# Patient Record
Sex: Female | Born: 1980 | Race: White | Hispanic: No | Marital: Married | Smoking: Never smoker
Health system: Southern US, Community
[De-identification: ages and names within clinical notes are randomized; demographics above are authoritative.]

## PROBLEM LIST (undated history)

## (undated) ENCOUNTER — Inpatient Hospital Stay (HOSPITAL_COMMUNITY): Payer: Self-pay

## (undated) DIAGNOSIS — M51369 Other intervertebral disc degeneration, lumbar region without mention of lumbar back pain or lower extremity pain: Secondary | ICD-10-CM

## (undated) DIAGNOSIS — M5136 Other intervertebral disc degeneration, lumbar region: Secondary | ICD-10-CM

## (undated) DIAGNOSIS — R002 Palpitations: Secondary | ICD-10-CM

## (undated) HISTORY — PX: BACK SURGERY: SHX140

## (undated) HISTORY — DX: Palpitations: R00.2

## (undated) HISTORY — PX: BREAST SURGERY: SHX581

## (undated) HISTORY — PX: TONSILLECTOMY: SUR1361

## (undated) HISTORY — DX: Other intervertebral disc degeneration, lumbar region without mention of lumbar back pain or lower extremity pain: M51.369

## (undated) HISTORY — DX: Other intervertebral disc degeneration, lumbar region: M51.36

## (undated) HISTORY — PX: ABDOMINAL HYSTERECTOMY: SHX81

## (undated) HISTORY — PX: DILATION AND CURETTAGE OF UTERUS: SHX78

---
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
## 2000-08-03 ENCOUNTER — Encounter
Admission: RE | Admit: 2000-08-03 | Discharge: 2000-08-11 | Payer: Self-pay | Admitting: Physical Medicine and Rehabilitation

## 2000-12-23 ENCOUNTER — Other Ambulatory Visit: Admission: RE | Admit: 2000-12-23 | Discharge: 2001-01-10 | Payer: Self-pay

## 2002-02-02 ENCOUNTER — Encounter: Payer: Self-pay | Admitting: Neurosurgery

## 2002-02-06 ENCOUNTER — Ambulatory Visit (HOSPITAL_COMMUNITY): Admission: RE | Admit: 2002-02-06 | Discharge: 2002-02-07 | Payer: Self-pay | Admitting: Neurosurgery

## 2002-02-06 ENCOUNTER — Encounter: Payer: Self-pay | Admitting: Neurosurgery

## 2003-02-22 ENCOUNTER — Inpatient Hospital Stay (HOSPITAL_COMMUNITY): Admission: RE | Admit: 2003-02-22 | Discharge: 2003-02-24 | Payer: Self-pay | Admitting: Neurosurgery

## 2003-02-22 ENCOUNTER — Encounter: Payer: Self-pay | Admitting: Neurosurgery

## 2003-03-17 ENCOUNTER — Encounter: Payer: Self-pay | Admitting: Emergency Medicine

## 2003-03-17 ENCOUNTER — Emergency Department (HOSPITAL_COMMUNITY): Admission: EM | Admit: 2003-03-17 | Discharge: 2003-03-17 | Payer: Self-pay | Admitting: Emergency Medicine

## 2003-04-22 ENCOUNTER — Other Ambulatory Visit: Admission: RE | Admit: 2003-04-22 | Discharge: 2003-04-22 | Payer: Self-pay | Admitting: Internal Medicine

## 2004-06-03 ENCOUNTER — Other Ambulatory Visit: Admission: RE | Admit: 2004-06-03 | Discharge: 2004-06-03 | Payer: Self-pay | Admitting: Obstetrics & Gynecology

## 2005-07-12 ENCOUNTER — Other Ambulatory Visit: Admission: RE | Admit: 2005-07-12 | Discharge: 2005-07-12 | Payer: Self-pay | Admitting: Obstetrics & Gynecology

## 2006-07-26 ENCOUNTER — Emergency Department (HOSPITAL_COMMUNITY): Admission: EM | Admit: 2006-07-26 | Discharge: 2006-07-26 | Payer: Self-pay | Admitting: Family Medicine

## 2006-07-27 ENCOUNTER — Emergency Department (HOSPITAL_COMMUNITY): Admission: EM | Admit: 2006-07-27 | Discharge: 2006-07-27 | Payer: Self-pay | Admitting: Emergency Medicine

## 2008-09-17 ENCOUNTER — Ambulatory Visit (HOSPITAL_COMMUNITY): Admission: RE | Admit: 2008-09-17 | Discharge: 2008-09-17 | Payer: Self-pay | Admitting: Obstetrics and Gynecology

## 2008-09-18 ENCOUNTER — Inpatient Hospital Stay (HOSPITAL_COMMUNITY): Admission: AD | Admit: 2008-09-18 | Discharge: 2008-09-18 | Payer: Self-pay | Admitting: Obstetrics and Gynecology

## 2009-03-25 ENCOUNTER — Ambulatory Visit (HOSPITAL_COMMUNITY): Admission: RE | Admit: 2009-03-25 | Discharge: 2009-03-25 | Payer: Self-pay | Admitting: Obstetrics and Gynecology

## 2009-03-25 ENCOUNTER — Encounter (INDEPENDENT_AMBULATORY_CARE_PROVIDER_SITE_OTHER): Payer: Self-pay | Admitting: Obstetrics and Gynecology

## 2009-09-15 ENCOUNTER — Encounter: Payer: Self-pay | Admitting: Cardiology

## 2009-10-15 ENCOUNTER — Ambulatory Visit: Admission: RE | Admit: 2009-10-15 | Discharge: 2009-10-15 | Payer: Self-pay | Admitting: Obstetrics and Gynecology

## 2009-10-15 ENCOUNTER — Encounter (INDEPENDENT_AMBULATORY_CARE_PROVIDER_SITE_OTHER): Payer: Self-pay | Admitting: Obstetrics and Gynecology

## 2009-10-15 ENCOUNTER — Ambulatory Visit: Payer: Self-pay | Admitting: Internal Medicine

## 2009-10-31 ENCOUNTER — Inpatient Hospital Stay (HOSPITAL_COMMUNITY): Admission: AD | Admit: 2009-10-31 | Discharge: 2009-10-31 | Payer: Self-pay | Admitting: Obstetrics and Gynecology

## 2010-01-12 DIAGNOSIS — R002 Palpitations: Secondary | ICD-10-CM

## 2010-01-12 DIAGNOSIS — G8929 Other chronic pain: Secondary | ICD-10-CM | POA: Insufficient documentation

## 2010-01-12 DIAGNOSIS — M549 Dorsalgia, unspecified: Secondary | ICD-10-CM

## 2010-01-20 ENCOUNTER — Ambulatory Visit: Payer: Self-pay | Admitting: Cardiology

## 2010-04-03 ENCOUNTER — Inpatient Hospital Stay (HOSPITAL_COMMUNITY): Admission: RE | Admit: 2010-04-03 | Discharge: 2010-04-05 | Payer: Self-pay | Admitting: Obstetrics and Gynecology

## 2010-12-01 NOTE — Assessment & Plan Note (Signed)
Summary: np6/palps/25 weeks/jml   Visit Type:  new pt visit Referring Provider:  Huel Cote Primary Provider:  Huel Cote  CC:  palpitations...pt is [redacted] wk gestation...history of MVP.  History of Present Illness: Mrs Ewald is a delightful 30 year old married white female who comes today for palpitations and a history of mitral valve prolapse.  She's [redacted] weeks pregnant. She has noted increased palpitations which she has not had since grammar school over the last several weeks. She notes in particular she first lays down on her left side. 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 She thinks that her baby is compressing her heart.  She describes what sounds like an echocardiogram back years ago. She was told them by Dr.Schall that she had mitral valve prolapse. She wore monitor but does not remember she had premature beats or not. There is no history of SVT per se.  She exercises on a regular basis. Her palpitations are not made worse by this. She's had no presyncope or syncope. She denies orthostatic symptoms. She's also had no chest pain.  Current Medications (verified): 1)  Fish Oil 1000 Mg Caps (Omega-3 Fatty Acids) .Marland Kitchen.. 1 Cap Once Daily 2)  Prenatal Vitamins 0.8 Mg Tabs (Prenatal Multivit-Min-Fe-Fa) .Marland Kitchen.. 1 Tab Once Daily  Allergies (verified): 1)  ! Codeine  Past History:  Family History: Last updated: 01/20/2010 Father: recent heart disease at 55 in the last 2 yrs.  Social History: Last updated: 01/20/2010 Tobacco Use - No.  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  Social History: Tobacco Use - No.  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 Head:  normocephalic and atraumatic Eyes:  PERRLA/EOM intact; conjunctiva and lids normal. Mouth:  Teeth, gums and palate normal. Oral mucosa normal. Neck:  Neck supple, no JVD. No masses, thyromegaly or abnormal cervical nodes. Chest Ethelyn Cerniglia:  no deformities or breast masses noted Lungs:  Clear bilaterally to auscultation and percussion. Heart:  PMI nondisplaced, regular rate and rhythm, question of a split S. one at the apex no clear click. No murmur. This was in the left lateral decubitus position. Abdomen:  gravida, good bowel sounds Msk:  Back normal, normal gait. Muscle strength and tone normal. Pulses:  pulses normal in all 4 extremities Extremities:  No clubbing or cyanosis. Neurologic:  Alert and oriented x 3. Skin:  Intact without lesions or rashes. Psych:  Normal affect.   Echocardiogram  Procedure date:  10/15/2009  Findings:       Study Conclusions   Left ventricle: Systolic function was normal. The estimated ejection   fraction was in the range  of 55% to 60%. Jalonda Antigua motion was normal;   there were no regional Hadden Steig motion abnormalities.   Impressions:    - Normal study.   Transthoracic echocardiography. M-mode, complete 2D, spectral   Doppler, and color Doppler. Height: Height: 170.2cm. Height: 67in.   Weight: Weight: 54.5kg. Weight: 120lb. Body mass index: BMI:   18.8kg/m^2. Body surface area: BSA: 1.25m^2. Patient status:   Outpatient. Location: Echo laboratory.   Echocardiogram  Procedure date:  01/20/2010  Findings:      normal  sinus rhythm normal EKG  Impression & Recommendations:  Problem # 1:  PALPITATIONS (ICD-785.1) Assessment Deteriorated I have had a long discussion with her today. I have explained to having a resurgence of probably what is premature beats and palpitations is not unusual with pregnancy. I do not think she needs medical therapy. If she begins to have symptoms of SVT which I explained to her in detail today or her palpitations are associated with activity I would like to see her back for evaluation. Otherwise I'll see her p.r.n. Orders: EKG w/ Interpretation (93000)  Problem # 2:  MITRAL VALVE PROLAPSE (ICD-424.0) Her echocardiogram was read by our team back in December. She has normal left ventricular function, no Elfie Costanza motion abnormalities, no LVH, normal chamber sizes, and no prolapse. She had trivial mitral regurgitation. I reviewed this with her today at length. She does not need prophylaxis for dental procedures. All questions answered.  Patient Instructions: 1)  Your physician recommends that you schedule a follow-up appointment in: AS NEEDED 2)  Your physician recommends that you continue on your current medications as directed. Please refer to the Current Medication list given to you today.

## 2011-01-18 LAB — CBC
Hemoglobin: 11.2 g/dL — ABNORMAL LOW (ref 12.0–15.0)
Platelets: 168 10*3/uL (ref 150–400)
RBC: 3.33 MIL/uL — ABNORMAL LOW (ref 3.87–5.11)
RDW: 13.1 % (ref 11.5–15.5)

## 2011-01-18 LAB — RPR: RPR Ser Ql: NONREACTIVE

## 2011-02-01 LAB — WET PREP, GENITAL: Clue Cells Wet Prep HPF POC: NONE SEEN

## 2011-02-09 LAB — CBC
Hemoglobin: 13.6 g/dL (ref 12.0–15.0)
MCV: 93.5 fL (ref 78.0–100.0)
RBC: 4.14 MIL/uL (ref 3.87–5.11)
RDW: 13.1 % (ref 11.5–15.5)
WBC: 6.8 10*3/uL (ref 4.0–10.5)

## 2011-03-16 NOTE — H&P (Signed)
Rose [name], Rose [name] NO.:  [id number]   MEDICAL RECORD NO.:  [id number]         PATIENT TYPE:  WAMB   LOCATION:                                FACILITY:  [facility]   PHYSICIAN:  Huel Cote, M.D. DATE OF BIRTH:  07/28/1981   DATE OF ADMISSION:  03/25/2009  DATE OF DISCHARGE:                              HISTORY & PHYSICAL   The patient is a 30 year old G2, P0-0-1-0 who is coming in with an  unfortunate finding of a missed AB on office ultrasound at approximately  7+ weeks with no fetal heart motion identified.  The patient had been  being followed closely due to a prior miscarriage and unfortunately was  found to have fetal demise on her ultrasound on May 21.  At that point,  the patient had some spotting but no significant cramping.  She had not  passed any tissue, but did have off and on bleeding for several days  prior to the D and C.  She had a prior miscarriage in November 2009 and  was able to pass this on her own as it was a very early in pregnancy.  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   PAST MEDICAL HISTORY:  Relatively insignificant.   PAST SURGICAL HISTORY:  She has had two back surgeries for ruptured  disks.   PAST OBSTETRICAL HISTORY:  The previous miscarriage and six weeks as  noted.   PAST GYN HISTORY:  No abnormal Pap smears.   ALLERGIES:  Codeine.   MEDICATIONS:  She is on no medications aside from prenatal vitamins.   PHYSICAL EXAMINATION:  The patient's blood pressure is normal at 100/50.  CARDIAC:  Regular rate and rhythm.  LUNGS:  Clear.  ABDOMEN:  Soft and nontender.  PELVIC:  She has normal genitalia noted.  Cervix is closed and uterus  consistent with 7-8 weeks size.   The patient was counseled as to all of her options including expected  management and Cytotec and D and C.  Given all of these options, she  requests  a D and C and this will be set up for the patient as soon as  possible.  We will  use Cytotec prior to procedure 3-4 hours and  doxycycline before and after procedure to minimize any risk of uterine  perforation, as well as post procedure infection.  We did discuss all  risks and benefits including uterine perforation  and other risks and the patient is aware of these and desires to  proceed.  Her blood type is A+ and therefore she does not need RhoGAM.  Again, we will proceed with a D and C as stated.  The patient is aware  and would like to proceed.      Huel Cote, M.D.  Electronically Signed     KR/MEDQ  D:  03/24/2009  T:  03/24/2009  Job:  [id number]

## 2011-03-16 NOTE — Op Note (Signed)
Rose [name], Rose [name]                ACCOUNT NO.:  [id number]   MEDICAL RECORD NO.:  [id number]          PATIENT TYPE:  AMB   LOCATION:  SDC                           FACILITY:  [facility]   PHYSICIAN:  Huel Cote, M.D. DATE OF BIRTH:  06-08-1981   DATE OF PROCEDURE:  DATE OF DISCHARGE:                               OPERATIVE REPORT   PREOPERATIVE DIAGNOSES:  1. Missed abortion at 7+ weeks.  2. Recurrent spontaneous abortion.   POSTOPERATIVE DIAGNOSES:  1. Missed abortion at 7+ weeks.  2. Recurrent spontaneous abortion.   PROCEDURE:  Suction D and E.   SURGEON:  Huel Cote, MD   ANESTHESIA:  MAC with 1% paracervical lidocaine block.   FINDINGS:  Moderate products of conception were noted.  Uterus was 7-8  weeks in size.   SPECIMEN:  Products of conception were sent to Pathology and for genetic  testing given the recurrent SAB problem.   ESTIMATED BLOOD LOSS:  50 mL.   URINE OUTPUT:  Straight cath prior to procedure 50 mL.   IV FLUIDS:  Approximately 1200 mL LR.   No known complications.   PROCEDURE:  The patient was taken to the operating room where MAC  anesthesia was obtained without difficulty.  She was then prepped and  draped in normal sterile fashion in the dorsal lithotomy position.  With  a speculum in place, the cervix was then injected with approximately 2  mL on the anterior lip and 9 mL each at 2 and 10 o'clock for a  paracervical block.  The cervix was then sequentially dilated up to 25  with [facility] dilators and sounded approximately 8 cm.  A 7-mm suction  curette was obtained and introduced easily into the fundus in several  passes.  A moderate amount of products of conception were obtained.  The  uterus was then sharply curettaged until no other significant tissue was  palpated.  Two additional passes of the suction did reveal no further  tissue.  There  was no active bleeding.  The tenaculum was removed, and one area treated  with silver nitrate  which was oozing slightly.  At the conclusion of the  procedure, all sponge, lap, and instrument counts were correct x2. and  the patient was taken to the recovery room, awakened and in good  condition.      Huel Cote, M.D.  Electronically Signed     KR/MEDQ  D:  03/25/2009  T:  03/25/2009  Job:  [id number]

## 2011-03-19 NOTE — H&P (Signed)
   NAME:  [name]                         ACCOUNT NO.:  [id number]   MEDICAL RECORD NO.:  [id number]                   PATIENT TYPE:  INP   LOCATION:  3013                                 FACILITY:  [facility]   PHYSICIAN:  Hilda Lias, M.D.                DATE OF BIRTH:  10/19/1981   DATE OF ADMISSION:  02/22/2003  DATE OF DISCHARGE:                                HISTORY & PHYSICAL   HISTORY OF PRESENT ILLNESS:  Ms. Rose [name] is a lady who underwent L4-L5  diskectomy about a year ago.  The patient did well but she went back to  dancing and for the past three or four months has been complaining of back  pain which radiates around to the left foot associated with weakness of  plantar flexion and no dorsiflexion like before.  By x-ray, she has a  herniated disk at the L5-1.  She is only 30 years old and we tried to be  conservative.  She had medication, she had chiropractor manipulation and in  view of her problem, she wants to proceed with surgery.   PAST MEDICAL HISTORY:  Left L4-L5 herniated disk more than a year ago.   ALLERGIES:  She is not allergic to any medications.   SOCIAL HISTORY:  Negative.   PHYSICAL EXAMINATION:  VITAL SIGNS:  She is 5 feet 5 inches and 108 pounds.  GENERAL:  The patient came to my office limping from the left leg.  HEENT:  Normal.  NECK:  Normal.  LUNGS:  Clear.  HEART:  Sounds normal.  There were no murmurs.  EXTREMITIES:  Normal.  NEURO:  Mental status normal.  Cranial nerves normal.  Strength is 5/5  except for 2/5 weakness of her plantar flexion.  She has numbness which  involves the S1 nerve root.  She is absent on the left ankle jerk.   RADIOLOGY:  MRI showed herniated disk at the L4-5 compromising the S1 level.   IMPRESSION:  Left L5-S1 herniated disk.   RECOMMENDATIONS:  She is going to be admitted for surgery.  The procedure  will be left L5-1 diskectomy using the C-arm and the Metrix.  She knows  about the risks such as infection,  CSF leak, worsening pain, paralysis, and  need for further surgery.                                               Hilda Lias, M.D.    EB/MEDQ  D:  02/22/2003  T:  02/23/2003  Job:  [id number]

## 2011-03-19 NOTE — Op Note (Signed)
NAME:  Rose [name]                         ACCOUNT NO.:  [id number]   MEDICAL RECORD NO.:  [id number]                   PATIENT TYPE:  INP   LOCATION:  3172                                 FACILITY:  [facility]   PHYSICIAN:  Hilda Lias, M.D.                DATE OF BIRTH:  1981-05-11   DATE OF PROCEDURE:  02/22/2003  DATE OF DISCHARGE:                                 OPERATIVE REPORT   PREOPERATIVE DIAGNOSIS:  Left L5-1 herniated disk with almost foot drop,  status post left L4-5 diskectomy.   POSTOPERATIVE DIAGNOSIS:  Left L5-1 herniated disk with almost foot drop,  status post left L4-5 diskectomy.   PROCEDURE:  Left L5-S1 diskectomy using the C-arm microscope foraminotomy.   SURGEON:  Hilda Lias, M.D.   CLINICAL HISTORY:  [name] is a 30 year old female who more than a year  ago underwent an L4-5 diskectomy.  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  For the past four months, she had the complaint of pain in  the left leg that eventually went away, but now, she has developed numbness  and weakness of plantar flexor at 1/5.  MRI showed some herniated disk at L5-  1, which was not present a  year ago.  I talked to her, and we agreed to  proceed with the treatment, and she failed with conservative treatment.  MRI  showed a herniated disk at L5-1.  I talked to her, her husband, and mother,  and we agreed with surgery.  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   PROCEDURE:  The patient was taken to the OR, and through the same incision  previously, an incision was carried down, resecting the previous scar.  A  retraction was done, and we were able to get into the L5 once.  At this  point, we decided not to go ahead with the metrics as we had planned it.  We  introduced a small retractor.  We brought the microscope into the area.  There was quite a bit of scar tissue.  Lysis was accomplished. We drilled  the lower lamina of L5  and the upper of S1.  The yellow ligaments were  excised.  The S1 nerve root was found to be swollen and reddish.  There was  quite a bit of adhesion, and retraction was accomplished medial.  Indeed,  there was a herniated disk with some sole ligament in extension.  An  incision was made into the disk space, and a total cord diskectomy with  removal of quite a bit of degenerative disk was accomplished.  At the area,  we had plenty of room for the L5 and S1 nerve root.  Foraminotomy was done.  On further maneuvering, it was negative.  __________ and Depo-Medrol were  left in the pleural space, and the wound was closed with  Vicryl and Steri-  Strips.  At the end of the procedure, we found she had some blister right  where the [place] was.  The area was cleaned with Betadine.                                               Hilda Lias, M.D.    EB/MEDQ  D:  02/22/2003  T:  02/25/2003  Job:  [id number]

## 2011-03-19 NOTE — H&P (Signed)
[place]. [facility]  Patient:    Rose [name], Rose [name] Visit Number: [id number] MRN: [id number]          Service Type: DSU Location: 3000 3037 [id number] Attending Physician:  Danella Penton Dictated by:   Tanya Nones. Jeral Fruit, M.D. Admit Date:  02/06/2002 Discharge Date: 02/07/2002                           History and Physical  HISTORY OF PRESENT ILLNESS:  Rose [name] is a lady who is 30 years old, who was seen by me because of back and left leg pain.  This problem had been going on since three years and back in July 2001, she had an MRI which showed a herniated disk at the level of 5-1 on the left side.  The patient has had conservative treatment including epidural injection with no improvement.  The patient had a repeat MRI and she came to see [place] for a second opinion.  She is getting worse.  She denies any pain in the right leg.  She is miserable.  She wants to proceed with surgery as soon as possible.  She came with her fiance and mother.  PAST MEDICAL HISTORY:  Negative.  ALLERGIES:  She is not allergic to any medications.  SOCIAL HISTORY:  Negative.  FAMILY HISTORY:  Negative.  REVIEW OF SYSTEMS:  Positive for sore throat, back pain and sinus headache.  PHYSICAL EXAMINATION:  GENERAL:  A patient who came to my office and she was limping from the left leg.  HEENT:  Normal.  NECK:  Normal.  LUNGS:  Clear.  HEART:  Heart sounds normal.  ABDOMEN:  Normal.  EXTREMITIES:  Normal pulses.  NEUROLOGIC:  Mental status normal.  Cranial nerves normal.  Strength 5/5 except in the left foot, where she has 3/5 weakness on dorsiflexion, 4/5 plantar flexion.  She has weakness of the biceps.  Coordination normal. sensation normal.  Reflexes symmetrical.  LABORATORY AND ACCESSORY DATA:  The MRI showed that indeed she has degenerative disk disease at the level of 4-5 with a herniated disk central and to the left with facet hypertrophy.  She had a  flexion and extension of the lumbar spine which was essentially negative.  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  RECOMMENDATION:  The patient is being admitted for surgery.  The risks were explained to her and she knows about infection, CSF leak, worsening of the pain, paralysis, need for further surgery and damage to the vessels of the abdomen. Dictated by:   Tanya Nones. Jeral Fruit, M.D. Attending Physician:  Danella Penton DD:  02/06/02 TD:  02/06/02 Job: [id number]

## 2011-03-19 NOTE — Op Note (Signed)
[place]. [facility]  Patient:    Rose [name], Rose [name] Visit Number: [id number] MRN: [id number]          Service Type: DSU Location: 3000 3037 01 Attending Physician:  Danella Penton Dictated by:   Tanya Nones. Jeral Fruit, M.D. Proc. Date: 02/06/02 Admit Date:  02/06/2002                             Operative Report  PREOPERATIVE DIAGNOSES:  Left L4-5 chronic herniated disk with hypertrophied facet.  POSTOPERATIVE DIAGNOSES:  Left L4-5 chronic herniated disk with hypertrophied facet.  PROCEDURE:  Left L4-5 diskectomy, foraminotomy, and microdissection.  Metrix system.  SURGEON:  Tanya Nones. Jeral Fruit, M.D.  ASSISTANT:  Payton Doughty, M.D.  CLINICAL HISTORY:  The patient is a 30 year old female complaining of back and left leg pain for almost three years.  She had had multiple epidural injections with no improvement.  As seen by MRI she had a herniated disk of the L4-5 with hypertrophic facet.  Clinically she has 3/5 weakness of dorsiflexion.  The patient wanted to go ahead with surgery.  The risks were explained.  PROCEDURE:  The patient was taken to the OR and she was proceeded to put on monitor.  The back was prepped with Betadine.  Drapes were applied.  Then using the C-arm we proceeded with a local incision of the space between L4-5. That was done easily and then we infiltrated the skin with Xylocaine with epinephrine.  Then about an inch incision was made through the skin, subcutaneous tissue, and fascia.  Using the dilator we were able to introduce a 4-cm dilator around 1 inch wide.  We repeated the C-arm to prove that indeed we were at the level L4-5.  Then with the drill, we drilled the lower lamina of L4, the upper of L5, and went through the medial facet.  The yellow ligament was also excised.  With the help of the microscope we removed the yellow ligament.  There was an opening in the ligament itself and we followed that to remove the whole  ligament.  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  There was no evidence of any CSF leak.  Then we followed proximally and we found the L5 nerve root.  The L5 nerve root was a little bit smaller than normal.  Retraction was made and indeed there was a large herniated disk compromising the takeoff of L5.  We removed the lateral yellow ligament.  Then we entered the disk space and a large amount of degenerative disk was removed.  Having done this, we repeated the x-rays in the lateral position, and a permanent film was taken which showed that indeed we were at the L4-5.  We did a foraminotomy.  Then we investigated the area medially and it was negative.  There was no evidence of more fragment and the disk was grossly empty.  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  Having done this, the area was irrigated. The wound was closed with a Vicryl and Steri-Strips.  The patient did well. Dictated by:   Tanya Nones. Jeral Fruit, M.D. Attending Physician:  Danella Penton DD:  02/06/02 TD:  02/06/02 Job: [id number]

## 2011-08-04 LAB — CBC
HCT: 38.4
Hemoglobin: 13.1
MCHC: 34.1
Platelets: 234
RBC: 4.12

## 2011-08-04 LAB — HCG, QUANTITATIVE, PREGNANCY: hCG, Beta Chain, Quant, S: 10483 — ABNORMAL HIGH

## 2012-06-08 ENCOUNTER — Encounter: Payer: Self-pay | Admitting: Cardiology

## 2012-06-27 LAB — OB RESULTS CONSOLE GC/CHLAMYDIA
Chlamydia: NEGATIVE
Gonorrhea: NEGATIVE

## 2012-06-27 LAB — OB RESULTS CONSOLE HIV ANTIBODY (ROUTINE TESTING): HIV: NONREACTIVE

## 2012-07-17 ENCOUNTER — Other Ambulatory Visit (HOSPITAL_COMMUNITY): Payer: Self-pay

## 2012-07-17 ENCOUNTER — Encounter (HOSPITAL_COMMUNITY): Payer: Self-pay | Admitting: *Deleted

## 2012-07-17 ENCOUNTER — Inpatient Hospital Stay (HOSPITAL_COMMUNITY)
Admission: AD | Admit: 2012-07-17 | Discharge: 2012-07-17 | Disposition: A | Discharge status: Home / Self Care | Payer: 59 | Source: Ambulatory Visit | Attending: Obstetrics and Gynecology | Admitting: Obstetrics and Gynecology

## 2012-07-17 ENCOUNTER — Inpatient Hospital Stay (HOSPITAL_COMMUNITY): Payer: 59

## 2012-07-17 DIAGNOSIS — O209 Hemorrhage in early pregnancy, unspecified: Secondary | ICD-10-CM | POA: Insufficient documentation

## 2012-07-17 DIAGNOSIS — O4690 Antepartum hemorrhage, unspecified, unspecified trimester: Secondary | ICD-10-CM | POA: Diagnosis present

## 2012-07-17 DIAGNOSIS — O468X9 Other antepartum hemorrhage, unspecified trimester: Secondary | ICD-10-CM

## 2012-07-17 DIAGNOSIS — O418X90 Other specified disorders of amniotic fluid and membranes, unspecified trimester, not applicable or unspecified: Secondary | ICD-10-CM

## 2012-07-17 HISTORY — DX: Other antepartum hemorrhage, unspecified trimester: O46.8X9

## 2012-07-17 LAB — CBC
HCT: 34.3 % — ABNORMAL LOW (ref 36.0–46.0)
MCH: 31 pg (ref 26.0–34.0)
MCV: 87.3 fL (ref 78.0–100.0)
RDW: 12.6 % (ref 11.5–15.5)
WBC: 8.1 10*3/uL (ref 4.0–10.5)

## 2012-07-17 NOTE — MAU Note (Signed)
Pt reports vaginal bleeding and cramping starting now.

## 2012-07-17 NOTE — MAU Provider Note (Signed)
Chief Complaint: Vaginal Bleeding   None    SUBJECTIVE HPI: Rose [name] is a 31 y.o. G1P0 at [redacted]w[redacted]d by LMP who presents to maternity admissions reporting waking up to large gush of vaginal bleeding, described as bright red and enough to soak through her clothes.  When she went to the bathroom after this gush, she passed several large clots.  Bleeding since the first episode has been less, with scant blood on her pad upon arrival to MAU. She reports she has not had intercourse since she found out about the pregnancy because she has a history of miscarriage.  She reports some mild abdominal cramping, denies vaginal itching/burning, urinary symptoms, h/a, dizziness, n/v, or fever/chills.     Past Medical History  Diagnosis Date  . Palpitations   . Backache, unspecified    Past Surgical History  Procedure Date  . Back surgery    History   Social History  . Marital Status: Married    Spouse Name: N/A    Number of Children: N/A  . Years of Education: N/A   Occupational History  . Not on file.   Social History Main Topics  . Smoking status: Never Smoker   . Smokeless tobacco: Not on file  . Alcohol Use: No  . Drug Use: No  . Sexually Active:    Other Topics Concern  . Not on file   Social History Narrative  . No narrative on file   No current facility-administered medications on file prior to encounter.   Current Outpatient Prescriptions on File Prior to Encounter  Medication Sig Dispense Refill  . Omega-3 Fatty Acids (FISH OIL PO) Take by mouth.      . Prenatal Vit-Fe Sulfate-FA (PRENATAL VITAMIN PO) Take by mouth.       Allergies  Allergen Reactions  . Codeine     ROS: Pertinent items in HPI  OBJECTIVE Blood pressure 104/48, pulse 99, temperature 100.1 F (37.8 C), temperature source Oral, resp. rate 18, last menstrual period 04/28/2012. GENERAL: Well-developed, well-nourished female in no acute distress.  HEENT: Normocephalic HEART: normal rate RESP: normal  effort ABDOMEN: Soft, non-tender EXTREMITIES: Nontender, no edema NEURO: Alert and oriented SPECULUM EXAM: Deferred at pt request  LAB RESULTS Results for orders placed during the hospital encounter of 07/17/12 (from the past 24 hour(s))  CBC     Status: Abnormal   Collection Time   07/17/12  2:08 AM      Component Value Range   WBC 8.1  4.0 - 10.5 K/uL   RBC 3.93  3.87 - 5.11 MIL/uL   Hemoglobin 12.2  12.0 - 15.0 g/dL   HCT 16.1 (*) 09.6 - 04.5 %   MCV 87.3  78.0 - 100.0 fL   MCH 31.0  26.0 - 34.0 pg   MCHC 35.6  30.0 - 36.0 g/dL   RDW 40.9  81.1 - 91.4 %   Platelets 196  150 - 400 K/uL    IMAGING US Ob Comp Less 14 Wks  07/17/2012  *RADIOLOGY REPORT*  Clinical Data: Pregnant patient with vaginal bleeding.  OBSTETRIC <14 WK ULTRASOUND  Technique:  Transabdominal ultrasound was performed for evaluation of the gestation as well as the maternal uterus and adnexal regions.  Comparison:  None.  Intrauterine gestational sac: Visualized/normal in shape. There appear to be two subchorionic hemorrhage is measuring 2.4 x 2.0 x 6.2 cm and 2.1 x 1.9 x 2.0 cm. Yolk sac: Not visualized. Embryo: Visualized. Cardiac Activity: Detected. Heart Rate: 167 bpm  MSD: 5.87 mm  12 w  3 d  Maternal uterus/Adnexae: Unremarkable.  IMPRESSION: Single living anterior pregnancy with two subchorionic hemorrhages identified.  The larger measures up to 6.2 cm in diameter.   Original Report Authenticated By: Bernadene Bell. D'ALESSIO, M.D.     ASSESSMENT 1. Subchorionic hemorrhage   2. Vaginal bleeding in pregnancy     PLAN Discharge home with bleeding precautions Reviewed risks of miscarriage r/t subchorionic hemorrhage with pt and s/o Call Dr Senaida Ores in the morning to make appointment Return to MAU as needed with increased bleeding, fever/chills, dizziness, or n/v     Medication List     As of 07/17/2012  2:11 AM    ASK your doctor about these medications         FISH OIL PO   Take by mouth.       PRENATAL VITAMIN PO   Take by mouth.         Sharen Counter Certified Nurse-Midwife 07/17/2012  2:11 AM

## 2012-11-15 ENCOUNTER — Encounter (INDEPENDENT_AMBULATORY_CARE_PROVIDER_SITE_OTHER): Payer: Self-pay | Admitting: Surgery

## 2012-11-21 ENCOUNTER — Ambulatory Visit (INDEPENDENT_AMBULATORY_CARE_PROVIDER_SITE_OTHER): Payer: 59 | Admitting: Surgery

## 2012-11-21 ENCOUNTER — Encounter (INDEPENDENT_AMBULATORY_CARE_PROVIDER_SITE_OTHER): Payer: Self-pay | Admitting: Surgery

## 2012-11-21 VITALS — BP 118/70 | HR 76 | Temp 98.6°F | Resp 18 | Ht 67.0 in | Wt 132.8 lb

## 2012-11-21 DIAGNOSIS — R1031 Right lower quadrant pain: Secondary | ICD-10-CM | POA: Insufficient documentation

## 2012-11-21 NOTE — Progress Notes (Signed)
Patient ID: Rose [name], female   DOB: 11/19/1980, 32 y.o.   MRN: [id number]  Chief Complaint  Patient presents with  . Hernia    HPI Rose [name] is a 32 y.o. female.  Referred by Dr. Senaida Ores for evaluation of possible right inguinal hernia HPI This is a 32 yo female who is [redacted] weeks pregnant with her second pregnancy.  Her first pregnancy was complicated by preterm labor, deep Grade III tearing, fractured coccyx per patient.  A C-section is planned for this delivery.  Over the last few weeks the patient has developed some unusual symptoms in her right groin and leg.  When she is supine, she is relatively asymptomatic.  When she stands up, she develops significant discomfort in the groin with swelling of the veins of the labia radiating back towards the anus, swelling of the veins of the buttock with pain and discomfort radiating down her posterior thigh.  She does develop some fullness of the right groin.  Bowel movements are unchanged.  The symptoms become worse the longer she stands.  The patient works as a Interior and spatial designer.  No imaging studies have been performed recently.  This pregnancy has been complicated by subchorionic hemorrhage.  Past Medical History  Diagnosis Date  . Palpitations   . Backache, unspecified     Past Surgical History  Procedure Date  . Back surgery   . Tonsillectomy     Family History  Problem Relation Age of Onset  . Heart disease Father     Social History History  Substance Use Topics  . Smoking status: Never Smoker   . Smokeless tobacco: Not on file  . Alcohol Use: No    Allergies  Allergen Reactions  . Codeine     Current Outpatient Prescriptions  Medication Sig Dispense Refill  . Prenatal Vit-Fe Sulfate-FA (PRENATAL VITAMIN PO) Take by mouth.        Review of Systems Review of Systems  Constitutional: Negative for fever, chills and unexpected weight change.  HENT: Negative for hearing loss, congestion, sore throat, trouble  swallowing and voice change.   Eyes: Negative for visual disturbance.  Respiratory: Negative for cough and wheezing.   Cardiovascular: Positive for palpitations and leg swelling. Negative for chest pain.  Gastrointestinal: Positive for rectal pain. Negative for nausea, vomiting, abdominal pain, diarrhea, constipation, blood in stool, abdominal distention and anal bleeding.  Genitourinary: Positive for pelvic pain. Negative for hematuria, vaginal bleeding and difficulty urinating.  Musculoskeletal: Negative for arthralgias.  Skin: Negative for rash and wound.  Neurological: Negative for seizures, syncope and headaches.  Hematological: Negative for adenopathy. Does not bruise/bleed easily.  Psychiatric/Behavioral: Negative for confusion.    Blood pressure 118/70, pulse 76, temperature 98.6 F (37 C), temperature source Temporal, resp. rate 18, height 5\' 7"  (1.702 m), weight 132 lb 12.8 oz (60.238 kg), last menstrual period 04/28/2012.  Physical Exam Physical Exam Thin female in NAD Lungs CTA B CV - RRR Abd - gravid uterus; flat umbilicus;non-tender Right groin - visible ecchymosis/ discoloration lateral in the groin; fullness of the right mons just lateral to the pubic tubercle - soft, does not enlarge with Valsalva;  Slight swelling of the labia majora R>L;  Left groin - no swelling or ecchymosis noted  Data Reviewed none  Assessment    Right groin pain, venous congestion and swelling - no obvious hernia on physical examination.  This would be a very unusual presentation for an inguinal hernia in a pregnant female.  Would be concerned  about venous compression/ congestion/ thrombosis causing these symptoms.      Plan    Right lower extremity/ pelvic venous duplex to evaluate for clot.  May need pelvic MRI to further evaluate.  If she does have a small hernia that is not palpated on examination, would not repair this hernia until after she has delivered.  Discussed with patient and  her husband.        Kathlyne Loud K. 11/21/2012, 7:49 PM

## 2012-11-22 ENCOUNTER — Other Ambulatory Visit (INDEPENDENT_AMBULATORY_CARE_PROVIDER_SITE_OTHER): Payer: Self-pay | Admitting: Surgery

## 2012-11-22 ENCOUNTER — Ambulatory Visit
Admission: RE | Admit: 2012-11-22 | Discharge: 2012-11-22 | Disposition: A | Discharge status: Home / Self Care | Payer: 59 | Source: Ambulatory Visit | Attending: Surgery | Admitting: Surgery

## 2012-11-22 DIAGNOSIS — R1031 Right lower quadrant pain: Secondary | ICD-10-CM

## 2012-11-22 NOTE — Progress Notes (Signed)
Patient ID: Rose [name], female   DOB: [birth year], 32 y.o.   MRN: [id number]   Clinical Data: Right swelling. Pregnant.  BILATERAL LOWER EXTREMITY VENOUS DOPPLER ULTRASOUND  Technique: Gray-scale sonography with compression, as well as color  and duplex ultrasound, were performed to evaluate the deep venous  system from the level of the common femoral vein through the  popliteal and proximal calf veins.  Comparison: None  Findings: Normal compressibility of the common femoral,  superficial femoral, and popliteal veins, as well as the proximal  calf veins. No filling defects to suggest DVT on grayscale or  color Doppler imaging. Doppler waveforms show normal direction of  venous flow, normal respiratory phasicity and response to  augmentation.  There are superficial tortuous veins in the right inguinal  subcutaneous tissues which are compressible without evidence of  superficial thrombophlebitis. There is a prominent dilated  venous cluster in the left inguinal region, similarly compressible  without evidence of superficial thrombophlebitis.  IMPRESSION:  No evidence of lower extremity deep vein thrombosis.  Original Report Authenticated By: D. Andria Rhein, MD  I spoke with patient regarding the results of her study. This showed no sign of clot. She does have dilated clusters of veins in both groins. 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 I discussed this with vascular surgery as well. There's not really any treatment during pregnancy. The patient may need to limit her work hours and stay off her feet as much as possible. There are no general surgical indications at this time. She may try wearing tighter support garments but she will discuss this further with her obstetrician at her next appointment.  Follow-up PRN.  Wilmon Arms. Corliss Skains, MD, [facility]  11/22/2012 4:51 PM

## 2013-01-03 LAB — OB RESULTS CONSOLE ABO/RH

## 2013-01-09 ENCOUNTER — Encounter (HOSPITAL_COMMUNITY): Payer: Self-pay | Admitting: Pharmacist

## 2013-01-17 LAB — OB RESULTS CONSOLE RPR: RPR: NONREACTIVE

## 2013-01-25 ENCOUNTER — Encounter (HOSPITAL_COMMUNITY): Payer: Self-pay

## 2013-01-26 ENCOUNTER — Encounter (HOSPITAL_COMMUNITY)
Admission: RE | Admit: 2013-01-26 | Discharge: 2013-01-26 | Disposition: A | Discharge status: Home / Self Care | Payer: 59 | Source: Ambulatory Visit | Attending: Obstetrics and Gynecology | Admitting: Obstetrics and Gynecology

## 2013-01-26 ENCOUNTER — Encounter (HOSPITAL_COMMUNITY): Payer: Self-pay

## 2013-01-26 LAB — CBC
HCT: 36.9 % (ref 36.0–46.0)
MCH: 31.6 pg (ref 26.0–34.0)
MCHC: 34.4 g/dL (ref 30.0–36.0)
MCV: 91.8 fL (ref 78.0–100.0)
RDW: 13.6 % (ref 11.5–15.5)

## 2013-01-26 LAB — TYPE AND SCREEN

## 2013-01-26 NOTE — Patient Instructions (Signed)
Your procedure is scheduled on:01/29/13  Enter through the Main Entrance at :6am Pick up desk phone and dial [phone] and inform us of your arrival.  Please call [phone] if you have any problems the morning of surgery.  Remember: Do not eat or drink after midnight:Sunday * water is ok until 3:30 am Monday  Take these meds the morning of surgery with a sip of water:none  DO NOT wear jewelry, eye make-up, lipstick,body lotion, or dark fingernail polish.   If you are to be admitted after surgery, leave suitcase in car until your room has been assigned.

## 2013-01-27 NOTE — H&P (Signed)
Rose [name] is a 32 y.o. female  G4P1021 at 39+ weeks (EDD 02/02/13 by LMP c/w 6 week [place]) presenting for scheduled c-section.  Pt had a vaginal delivery in 2011 with a third degree tear and broken coccyx..  She had a very hard time recovering and required pelvic PT for severe dyspareunia.  She has decide to have a c-section this pregnancy to avoid any further vaginal injury.  Otherwise her [facility] is significant for h/o preterm contractions and dilation at 32 weeks with her last pregnancy, so she received 17-OH progesterone this pregnancy weeks 15-36 In the second trimester she noted increased inguinal swelling R>L and was evaluated for a possible hernia.  Hernia was r/o and it was felt to be primarily pelvic congestion.  She has managed it conservatively with limited time on her feet.  No other significant issues.  Maternal Medical History:  Fetal activity: Perceived fetal activity is normal.    Prenatal Complications - Diabetes: none.    OB History   Grav Para Term Preterm Abortions TAB SAB Ect Mult Living   4 1 1  2  2   1     SAB 2009, 2010 Vacuum assisted delivery 6#15oz 2011  3rd degree tear and fractured coccyx  Past Medical History  Diagnosis Date  . Palpitations   . Backache, unspecified   . Medical history non-contributory    Past Surgical History  Procedure Laterality Date  . Tonsillectomy    . Back surgery      x 2   Family History: family history includes Heart disease in her father. Social History:  reports that she has never smoked. She does not have any smokeless tobacco history on file. She reports that she does not drink alcohol or use illicit drugs.   Prenatal Transfer Tool  Maternal Diabetes: No Genetic Screening: Normal Maternal Ultrasounds/Referrals: Normal Fetal Ultrasounds or other Referrals:  None Maternal Substance Abuse:  No Significant Maternal Medications:  None Significant Maternal Lab Results:  None Other Comments:  None  ROS    Last menstrual  period 04/28/2012. Maternal Exam:  Uterine Assessment: Contraction strength is mild.  Contraction frequency is irregular.   Abdomen: Patient reports no abdominal tenderness. Fetal presentation: vertex     Physical Exam  Constitutional: She appears well-developed and well-nourished.  Cardiovascular: Normal rate and regular rhythm.   Respiratory: Effort normal and breath sounds normal.  GI: Soft.  Genitourinary:  Varicosities in pelvis.  R>L Uterus gravid  Neurological: She is alert.  Psychiatric: She has a normal mood and affect. Her behavior is normal.    Prenatal labs: ABO, Rh: --/--/A POS (03/28 1035) Antibody: NEG (03/28 1035) Rubella:  Immune RPR: NON REACTIVE (03/28 1035)  HBsAg: Negative (08/27 0000)  HIV: Non-reactive (08/27 0000)  GBS:   Negative One hour GTT 106 First trimester screen and AFP WNL CF negative  Assessment/Plan: Pt admitted for primary c-section and all risks and benefits reviewed with her in detail.We discussed the risk of bleeding, infection and possible damage to bowel and bladder. The patient desires to proceed.  Oliver Pila 01/27/2013, 4:58 PM

## 2013-01-29 ENCOUNTER — Inpatient Hospital Stay (HOSPITAL_COMMUNITY): Payer: 59 | Admitting: Anesthesiology

## 2013-01-29 ENCOUNTER — Encounter (HOSPITAL_COMMUNITY)
Admission: AD | Disposition: A | Discharge status: Home / Self Care | Payer: Self-pay | Source: Ambulatory Visit | Attending: Obstetrics and Gynecology

## 2013-01-29 ENCOUNTER — Encounter (HOSPITAL_COMMUNITY): Payer: Self-pay | Admitting: General Practice

## 2013-01-29 ENCOUNTER — Inpatient Hospital Stay (HOSPITAL_COMMUNITY)
Admission: AD | Admit: 2013-01-29 | Discharge: 2013-02-01 | DRG: 766 | Disposition: A | Discharge status: Home / Self Care | Payer: 59 | Source: Ambulatory Visit | Attending: Obstetrics and Gynecology | Admitting: Obstetrics and Gynecology

## 2013-01-29 ENCOUNTER — Encounter (HOSPITAL_COMMUNITY): Payer: Self-pay | Admitting: Anesthesiology

## 2013-01-29 DIAGNOSIS — O99892 Other specified diseases and conditions complicating childbirth: Principal | ICD-10-CM | POA: Diagnosis present

## 2013-01-29 DIAGNOSIS — Z98891 History of uterine scar from previous surgery: Secondary | ICD-10-CM

## 2013-01-29 DIAGNOSIS — I868 Varicose veins of other specified sites: Secondary | ICD-10-CM | POA: Diagnosis present

## 2013-01-29 SURGERY — Surgical Case
Anesthesia: Spinal | Site: Abdomen | Wound class: Clean Contaminated

## 2013-01-29 MED ORDER — DIPHENHYDRAMINE HCL 25 MG PO CAPS: 25.0000 mg | ORAL_CAPSULE | ORAL | Status: DC | PRN

## 2013-01-29 MED ORDER — HYDROMORPHONE HCL PF 1 MG/ML IJ SOLN
INTRAMUSCULAR | Status: AC
  Administered 2013-01-29: 0.25 mg via INTRAVENOUS
  Filled 2013-01-29: qty 1

## 2013-01-29 MED ORDER — NALOXONE HCL 0.4 MG/ML IJ SOLN: 0.4000 mg | INTRAMUSCULAR | Status: DC | PRN

## 2013-01-29 MED ORDER — PHENYLEPHRINE HCL 10 MG/ML IJ SOLN
INTRAMUSCULAR | Status: DC | PRN
  Administered 2013-01-29 (×2): 80 ug via INTRAVENOUS
  Administered 2013-01-29 (×2): 40 ug via INTRAVENOUS
  Administered 2013-01-29 (×2): 80 ug via INTRAVENOUS

## 2013-01-29 MED ORDER — BUPIVACAINE IN DEXTROSE 0.75-8.25 % IT SOLN
INTRATHECAL | Status: DC | PRN
  Administered 2013-01-29: 1.6 mL via INTRATHECAL

## 2013-01-29 MED ORDER — LACTATED RINGERS IV SOLN
INTRAVENOUS | Status: DC
  Administered 2013-01-29 (×2): via INTRAVENOUS

## 2013-01-29 MED ORDER — DIBUCAINE 1 % RE OINT: 1.0000 "application " | TOPICAL_OINTMENT | RECTAL | Status: DC | PRN

## 2013-01-29 MED ORDER — TETANUS-DIPHTH-ACELL PERTUSSIS 5-2.5-18.5 LF-MCG/0.5 IM SUSP: 0.5000 mL | Freq: Once | INTRAMUSCULAR | Status: DC

## 2013-01-29 MED ORDER — CEFAZOLIN SODIUM-DEXTROSE 2-3 GM-% IV SOLR
INTRAVENOUS | Status: AC
  Filled 2013-01-29: qty 50

## 2013-01-29 MED ORDER — MENTHOL 3 MG MT LOZG
1.0000 | LOZENGE | OROMUCOSAL | Status: DC | PRN
  Administered 2013-01-30: 3 mg via ORAL
  Filled 2013-01-29: qty 9

## 2013-01-29 MED ORDER — SENNOSIDES-DOCUSATE SODIUM 8.6-50 MG PO TABS
2.0000 | ORAL_TABLET | Freq: Every day | ORAL | Status: DC
  Administered 2013-01-29 – 2013-02-01 (×3): 2 via ORAL

## 2013-01-29 MED ORDER — METOCLOPRAMIDE HCL 5 MG/ML IJ SOLN
INTRAMUSCULAR | Status: AC
  Filled 2013-01-29: qty 2

## 2013-01-29 MED ORDER — SCOPOLAMINE 1 MG/3DAYS TD PT72
1.0000 | MEDICATED_PATCH | Freq: Once | TRANSDERMAL | Status: DC
  Filled 2013-01-29: qty 1

## 2013-01-29 MED ORDER — OXYTOCIN 40 UNITS IN LACTATED RINGERS INFUSION - SIMPLE MED
INTRAVENOUS | Status: DC | PRN
  Administered 2013-01-29: 40 [IU] via INTRAVENOUS

## 2013-01-29 MED ORDER — LANOLIN HYDROUS EX OINT: 1.0000 "application " | TOPICAL_OINTMENT | CUTANEOUS | Status: DC | PRN

## 2013-01-29 MED ORDER — LACTATED RINGERS IV SOLN
INTRAVENOUS | Status: DC
  Administered 2013-01-29 (×4): via INTRAVENOUS

## 2013-01-29 MED ORDER — OXYCODONE-ACETAMINOPHEN 5-325 MG PO TABS
1.0000 | ORAL_TABLET | ORAL | Status: DC | PRN
  Administered 2013-01-29 – 2013-01-30 (×3): 1 via ORAL
  Administered 2013-01-30: 2 via ORAL
  Administered 2013-01-30: 1 via ORAL
  Administered 2013-01-30 – 2013-02-01 (×8): 2 via ORAL
  Administered 2013-02-01: 1 via ORAL
  Administered 2013-02-01 (×2): 2 via ORAL
  Filled 2013-01-29: qty 2
  Filled 2013-01-29 (×2): qty 1
  Filled 2013-01-29 (×3): qty 2
  Filled 2013-01-29: qty 1
  Filled 2013-01-29 (×7): qty 2
  Filled 2013-01-29: qty 1
  Filled 2013-01-29: qty 2

## 2013-01-29 MED ORDER — PHENYLEPHRINE 40 MCG/ML (10ML) SYRINGE FOR IV PUSH (FOR BLOOD PRESSURE SUPPORT)
PREFILLED_SYRINGE | INTRAVENOUS | Status: AC
  Filled 2013-01-29: qty 5

## 2013-01-29 MED ORDER — IBUPROFEN 600 MG PO TABS
600.0000 mg | ORAL_TABLET | Freq: Four times a day (QID) | ORAL | Status: DC | PRN
  Filled 2013-01-29 (×5): qty 1

## 2013-01-29 MED ORDER — MEPERIDINE HCL 25 MG/ML IJ SOLN: 6.2500 mg | INTRAMUSCULAR | Status: DC | PRN

## 2013-01-29 MED ORDER — DIPHENHYDRAMINE HCL 50 MG/ML IJ SOLN: 12.5000 mg | INTRAMUSCULAR | Status: DC | PRN

## 2013-01-29 MED ORDER — DIPHENHYDRAMINE HCL 25 MG PO CAPS: 25.0000 mg | ORAL_CAPSULE | Freq: Four times a day (QID) | ORAL | Status: DC | PRN

## 2013-01-29 MED ORDER — KETOROLAC TROMETHAMINE 30 MG/ML IJ SOLN: 30.0000 mg | Freq: Four times a day (QID) | INTRAMUSCULAR | Status: AC | PRN

## 2013-01-29 MED ORDER — DIPHENHYDRAMINE HCL 50 MG/ML IJ SOLN: 25.0000 mg | INTRAMUSCULAR | Status: DC | PRN

## 2013-01-29 MED ORDER — MORPHINE SULFATE 0.5 MG/ML IJ SOLN
INTRAMUSCULAR | Status: AC
  Filled 2013-01-29: qty 10

## 2013-01-29 MED ORDER — SODIUM CHLORIDE 0.9 % IJ SOLN: 3.0000 mL | INTRAMUSCULAR | Status: DC | PRN

## 2013-01-29 MED ORDER — FENTANYL CITRATE 0.05 MG/ML IJ SOLN
INTRAMUSCULAR | Status: DC | PRN
  Administered 2013-01-29: 25 ug via INTRATHECAL

## 2013-01-29 MED ORDER — NALBUPHINE HCL 10 MG/ML IJ SOLN
5.0000 mg | INTRAMUSCULAR | Status: DC | PRN
  Filled 2013-01-29: qty 1

## 2013-01-29 MED ORDER — HYDROMORPHONE HCL PF 1 MG/ML IJ SOLN: 0.2500 mg | INTRAMUSCULAR | Status: DC | PRN

## 2013-01-29 MED ORDER — METOCLOPRAMIDE HCL 5 MG/ML IJ SOLN
INTRAMUSCULAR | Status: DC | PRN
  Administered 2013-01-29 (×2): 5 mg via INTRAVENOUS

## 2013-01-29 MED ORDER — LACTATED RINGERS IV SOLN
INTRAVENOUS | Status: DC
  Administered 2013-01-29: 08:00:00 via INTRAVENOUS

## 2013-01-29 MED ORDER — SIMETHICONE 80 MG PO CHEW: 80.0000 mg | CHEWABLE_TABLET | ORAL | Status: DC | PRN

## 2013-01-29 MED ORDER — NALOXONE HCL 1 MG/ML IJ SOLN
1.0000 ug/kg/h | INTRAVENOUS | Status: DC | PRN
  Filled 2013-01-29: qty 2

## 2013-01-29 MED ORDER — EPHEDRINE SULFATE 50 MG/ML IJ SOLN
INTRAMUSCULAR | Status: DC | PRN
  Administered 2013-01-29 (×3): 5 mg via INTRAVENOUS
  Administered 2013-01-29 (×2): 10 mg via INTRAVENOUS

## 2013-01-29 MED ORDER — EPHEDRINE 5 MG/ML INJ
INTRAVENOUS | Status: AC
  Filled 2013-01-29: qty 10

## 2013-01-29 MED ORDER — WITCH HAZEL-GLYCERIN EX PADS: 1.0000 "application " | MEDICATED_PAD | CUTANEOUS | Status: DC | PRN

## 2013-01-29 MED ORDER — FENTANYL CITRATE 0.05 MG/ML IJ SOLN
INTRAMUSCULAR | Status: AC
  Filled 2013-01-29: qty 2

## 2013-01-29 MED ORDER — CEFAZOLIN SODIUM-DEXTROSE 2-3 GM-% IV SOLR
2.0000 g | INTRAVENOUS | Status: AC
  Administered 2013-01-29: 2 g via INTRAVENOUS

## 2013-01-29 MED ORDER — ONDANSETRON HCL 4 MG/2ML IJ SOLN
INTRAMUSCULAR | Status: DC | PRN
  Administered 2013-01-29 (×2): 4 mg via INTRAVENOUS

## 2013-01-29 MED ORDER — SIMETHICONE 80 MG PO CHEW
80.0000 mg | CHEWABLE_TABLET | Freq: Three times a day (TID) | ORAL | Status: DC
  Administered 2013-01-29 – 2013-02-01 (×11): 80 mg via ORAL

## 2013-01-29 MED ORDER — ONDANSETRON HCL 4 MG/2ML IJ SOLN
INTRAMUSCULAR | Status: AC
  Filled 2013-01-29: qty 2

## 2013-01-29 MED ORDER — SCOPOLAMINE 1 MG/3DAYS TD PT72
MEDICATED_PATCH | TRANSDERMAL | Status: AC
  Administered 2013-01-29: 1.5 mg via TRANSDERMAL
  Filled 2013-01-29: qty 1

## 2013-01-29 MED ORDER — SCOPOLAMINE 1 MG/3DAYS TD PT72: 1.0000 | MEDICATED_PATCH | Freq: Once | TRANSDERMAL | Status: DC

## 2013-01-29 MED ORDER — ONDANSETRON HCL 4 MG PO TABS
4.0000 mg | ORAL_TABLET | ORAL | Status: DC | PRN
  Administered 2013-01-30: 4 mg via ORAL
  Filled 2013-01-29: qty 1

## 2013-01-29 MED ORDER — MORPHINE SULFATE (PF) 0.5 MG/ML IJ SOLN
INTRAMUSCULAR | Status: DC | PRN
  Administered 2013-01-29: .1 mg via INTRATHECAL

## 2013-01-29 MED ORDER — ONDANSETRON HCL 4 MG/2ML IJ SOLN: 4.0000 mg | INTRAMUSCULAR | Status: DC | PRN

## 2013-01-29 MED ORDER — IBUPROFEN 600 MG PO TABS
600.0000 mg | ORAL_TABLET | Freq: Four times a day (QID) | ORAL | Status: DC
  Administered 2013-01-29 – 2013-02-01 (×12): 600 mg via ORAL
  Filled 2013-01-29 (×7): qty 1

## 2013-01-29 MED ORDER — KETOROLAC TROMETHAMINE 60 MG/2ML IM SOLN
INTRAMUSCULAR | Status: AC
  Administered 2013-01-29: 60 mg via INTRAMUSCULAR
  Filled 2013-01-29: qty 2

## 2013-01-29 MED ORDER — ONDANSETRON HCL 4 MG/2ML IJ SOLN: 4.0000 mg | Freq: Three times a day (TID) | INTRAMUSCULAR | Status: DC | PRN

## 2013-01-29 MED ORDER — NALBUPHINE SYRINGE 5 MG/0.5 ML
INJECTION | INTRAMUSCULAR | Status: AC
  Administered 2013-01-29: 10 mg via INTRAVENOUS
  Filled 2013-01-29: qty 1

## 2013-01-29 MED ORDER — ZOLPIDEM TARTRATE 5 MG PO TABS: 5.0000 mg | ORAL_TABLET | Freq: Every evening | ORAL | Status: DC | PRN

## 2013-01-29 MED ORDER — METOCLOPRAMIDE HCL 5 MG/ML IJ SOLN: 10.0000 mg | Freq: Three times a day (TID) | INTRAMUSCULAR | Status: DC | PRN

## 2013-01-29 MED ORDER — SODIUM CHLORIDE 0.9 % IR SOLN
Status: DC | PRN
  Administered 2013-01-29: 1000 mL

## 2013-01-29 MED ORDER — OXYTOCIN 10 UNIT/ML IJ SOLN
INTRAMUSCULAR | Status: AC
  Filled 2013-01-29: qty 4

## 2013-01-29 MED ORDER — PRENATAL MULTIVITAMIN CH
1.0000 | ORAL_TABLET | Freq: Every day | ORAL | Status: DC
  Administered 2013-01-30 – 2013-02-01 (×3): 1 via ORAL
  Filled 2013-01-29 (×3): qty 1

## 2013-01-29 MED ORDER — OXYTOCIN 40 UNITS IN LACTATED RINGERS INFUSION - SIMPLE MED: 62.5000 mL/h | INTRAVENOUS | Status: AC

## 2013-01-29 MED ORDER — KETOROLAC TROMETHAMINE 60 MG/2ML IM SOLN: 60.0000 mg | Freq: Once | INTRAMUSCULAR | Status: AC | PRN

## 2013-01-29 SURGICAL SUPPLY — 36 items
APL SKNCLS STERI-STRIP NONHPOA (GAUZE/BANDAGES/DRESSINGS) ×1
BENZOIN TINCTURE PRP APPL 2/3 (GAUZE/BANDAGES/DRESSINGS) ×1 IMPLANT
CLOTH BEACON ORANGE TIMEOUT ST (SAFETY) ×2 IMPLANT
CONTAINER PREFILL 10% NBF 15ML (MISCELLANEOUS) IMPLANT
DRAPE LG THREE QUARTER DISP (DRAPES) ×2 IMPLANT
DRSG OPSITE POSTOP 4X10 (GAUZE/BANDAGES/DRESSINGS) ×2 IMPLANT
DURAPREP 26ML APPLICATOR (WOUND CARE) ×2 IMPLANT
ELECT REM PT RETURN 9FT ADLT (ELECTROSURGICAL) ×2
ELECTRODE REM PT RTRN 9FT ADLT (ELECTROSURGICAL) ×1 IMPLANT
EXTRACTOR VACUUM KIWI (MISCELLANEOUS) IMPLANT
EXTRACTOR VACUUM M CUP 4 TUBE (SUCTIONS) IMPLANT
GLOVE BIO SURGEON STRL SZ 6.5 (GLOVE) ×2 IMPLANT
GOWN STRL REIN XL XLG (GOWN DISPOSABLE) ×4 IMPLANT
KIT ABG SYR 3ML LUER SLIP (SYRINGE) IMPLANT
NDL HYPO 25X5/8 SAFETYGLIDE (NEEDLE) IMPLANT
NEEDLE HYPO 25X5/8 SAFETYGLIDE (NEEDLE) IMPLANT
NS IRRIG 1000ML POUR BTL (IV SOLUTION) ×2 IMPLANT
PACK C SECTION WH (CUSTOM PROCEDURE TRAY) ×2 IMPLANT
PAD OB MATERNITY 4.3X12.25 (PERSONAL CARE ITEMS) ×2 IMPLANT
RTRCTR C-SECT PINK 25CM LRG (MISCELLANEOUS) ×2 IMPLANT
SLEEVE SCD COMPRESS KNEE MED (MISCELLANEOUS) IMPLANT
STAPLER VISISTAT 35W (STAPLE) IMPLANT
STRIP CLOSURE SKIN 1/2X4 (GAUZE/BANDAGES/DRESSINGS) ×1 IMPLANT
SUT CHROMIC 1 CTX 36 (SUTURE) ×4 IMPLANT
SUT PLAIN 0 NONE (SUTURE) IMPLANT
SUT PLAIN 2 0 XLH (SUTURE) IMPLANT
SUT VIC AB 0 CT1 27 (SUTURE) ×4
SUT VIC AB 0 CT1 27XBRD ANBCTR (SUTURE) ×2 IMPLANT
SUT VIC AB 2-0 CT1 27 (SUTURE)
SUT VIC AB 2-0 CT1 TAPERPNT 27 (SUTURE) IMPLANT
SUT VIC AB 2-0 SH 27 (SUTURE) ×2
SUT VIC AB 2-0 SH 27XBRD (SUTURE) IMPLANT
SUT VIC AB 4-0 KS 27 (SUTURE) ×1 IMPLANT
TOWEL OR 17X24 6PK STRL BLUE (TOWEL DISPOSABLE) ×6 IMPLANT
TRAY FOLEY CATH 14FR (SET/KITS/TRAYS/PACK) ×2 IMPLANT
WATER STERILE IRR 1000ML POUR (IV SOLUTION) ×2 IMPLANT

## 2013-01-29 NOTE — Op Note (Signed)
Operative note  Preoperative diagnosis Term pregnancy at 39+ weeks gestation Prior vaginal delivery with third degree laceration and broken coccyx  Postoperative diagnosis Same  Procedure Primary low transverse cesarean section with 2 layer closure of uterus  Surgeon Dr. Huel Cote  Anesthesia Spinal  Specimen Placenta sent to L&D  Fluids Estimated blood loss 800 cc Urine output 200 cc clear urine IV fluid 2200 cc LR  Findings There is a viable female infant in the vertex presentation. Apgars 8 and 9. Weight pending at time of dictation. Uterus ovaries and tubes were normal.  Procedure note Patient was taken to the operating room where spinal anesthesia was obtained without difficulty. She was then prepped and draped in the normal sterile fashion in the dorsal supine position with a leftward tilt. An appropriate time out was performed. A Pfannenstiel skin incision was then made with the scalpel and carried through to the underlying layer of fascia by sharp dissection and Bovie cautery. The fascia was then nicked in the midline and the incision was extended laterally. The inferior aspect was grasped with Coker clamps elevated and dissected off the underlying rectus muscles a similar fashion the superior aspect was dissected off the rectus muscles. The muscles were then separated in the midline and the peritoneal cavity entered bluntly. The peritoneal incision was then extended both superiorly and inferiorly with careful attention to bowel and bladder.  The Alexis self-retaining wound retractor was placed within the incision and the lower uterine segment exposed. The lower uterine segment was then incised in a transverse fashion and the cavity itself entered bluntly. The infant's head was then delivered atraumatically nose and mouth bulb suctioned and the remainder of the infant delivered without difficulty. Cord was clamped and cut and infant was handed to the waiting  pediatricians. The placenta was then expressed spontaneously and handed off for cord blood donation and the uterus cleared of all clots and debris. The uterine incision was then closed in 2 layers the first a running locked layer 1-0 chromic and the second an imbricating layer of the same suture. The gutters and adnexa were inspected and found to be normal and clear the incision was hemostatic except for one small area at the upper right which was reinforced with figure-of-eight suture of 3-0 Vicryl. At this point all appeared hemostatic so the Alexis retractor was removed from the abdomen as well as all instruments and sponges the rectus muscles and peritoneum were reapproximated with several interrupted mattress sutures of 2-0 Vicryl. The fascia was closed with 0 Vicryl in a running fashion. The skin was closed with a subcuticular stitch of 4-0 Vicryl on a Keith needle. Again all instruments and sponge counts were correct x2 and the patient and baby were taking to the recovery room in good condition.

## 2013-01-29 NOTE — Progress Notes (Signed)
Patient ID: Rose [name], female   DOB: 1981-08-02, 32 y.o.   MRN: [id number] Per pt no changes in dictated H&P.  Ready to proceed.  Brief exam WNL.

## 2013-01-29 NOTE — OR Nursing (Addendum)
Uterus massaged by S. Alyzza Andringa RN. Two tubes of cord blood sent to lab.  0cc of blood evacuated from uterus during uterine massage. 

## 2013-01-29 NOTE — Progress Notes (Signed)
Dr Jackelyn Knife notified of patient's pain and location of pain during and immediately after ambulation.  Vital signs  remained normal .

## 2013-01-29 NOTE — Anesthesia Procedure Notes (Signed)
Spinal  Patient location during procedure: OR Start time: 01/29/2013 7:38 AM Staffing Anesthesiologist: Angus Seller., Harrell Gave. Performed by: anesthesiologist  Preanesthetic Checklist Completed: patient identified, site marked, surgical consent, pre-op evaluation, timeout performed, IV checked, risks and benefits discussed and monitors and equipment checked Spinal Block Patient position: sitting Prep: DuraPrep Patient monitoring: heart rate, cardiac monitor, continuous pulse ox and blood pressure Approach: midline Location: L3-4 Injection technique: single-shot Needle Needle type: Sprotte  Needle gauge: 24 G Needle length: 9 cm Assessment Sensory level: T4 Additional Notes Patient identified.  Risk benefits discussed including failed block, incomplete pain control, headache, nerve damage, paralysis, blood pressure changes, nausea, vomiting, reactions to medication both toxic or allergic, and postpartum back pain.  Patient expressed understanding and wished to proceed.  All questions were answered.  Sterile technique used throughout procedure.  CSF was clear.  No parasthesia or other complications.  Please see nursing notes for vital signs.

## 2013-01-29 NOTE — Brief Op Note (Signed)
01/29/2013  8:40 AM  PATIENT:  Rose [name]  32 y.o. female  PRE-OPERATIVE DIAGNOSIS:  history of bad vaginal laceration edc 02/02/13  POST-OPERATIVE DIAGNOSIS:  history of bad vaginal laceration edc 02/02/13  PROCEDURE:  Procedure(s) with comments: Primary cesarean section with delivery of baby  (N/A) - Primary  SURGEON:  Surgeon(s) and Role:    * Sherron Monday, MD - Assisting    * Oliver Pila, MD - Primary   ANESTHESIA:   spinal  EBL:  Total I/O In: 2000 [I.V.:2000] Out: 950 [Urine:250; Blood:700]  BLOOD ADMINISTERED:none  DRAINS: Urinary Catheter (Foley)   LOCAL MEDICATIONS USED:  NONE  SPECIMEN: Placenta  DISPOSITION OF SPECIMEN:  L&D  COUNTS:  YES  TOURNIQUET:  * No tourniquets in log *  DICTATION: .Dragon Dictation  PLAN OF CARE: Admit to inpatient   PATIENT DISPOSITION:  PACU - hemodynamically stable.

## 2013-01-29 NOTE — Anesthesia Postprocedure Evaluation (Signed)
Anesthesia Post Note  Patient: Rose [name]  Procedure(s) Performed: Procedure(s) (LRB): Primary cesarean section with delivery of baby  (N/A)  Anesthesia type: Spinal  Patient location: PACU  Post pain: Pain level controlled  Post assessment: Post-op Vital signs reviewed  Last Vitals:  Filed Vitals:   01/29/13 0900  BP: 103/60  Pulse: 67  Temp:   Resp: 18    Post vital signs: Reviewed  Level of consciousness: awake  Complications: No apparent anesthesia complications

## 2013-01-29 NOTE — Anesthesia Preprocedure Evaluation (Addendum)

## 2013-01-29 NOTE — Transfer of Care (Signed)
Immediate Anesthesia Transfer of Care Note  Patient: Rose [name]  Procedure(s) Performed: Procedure(s) with comments: Primary cesarean section with delivery of baby  (N/A) - Primary  Patient Location: PACU  Anesthesia Type:Spinal  Level of Consciousness: awake, alert  and oriented  Airway & Oxygen Therapy: Patient Spontanous Breathing  Post-op Assessment: Report given to PACU RN and Post -op Vital signs reviewed and stable  Post vital signs: Reviewed and stable  Complications: No apparent anesthesia complications

## 2013-01-30 ENCOUNTER — Encounter (HOSPITAL_COMMUNITY): Payer: Self-pay | Admitting: Obstetrics and Gynecology

## 2013-01-30 LAB — CBC
Hemoglobin: 11.4 g/dL — ABNORMAL LOW (ref 12.0–15.0)
MCH: 31.7 pg (ref 26.0–34.0)
MCV: 92.8 fL (ref 78.0–100.0)
Platelets: 147 10*3/uL — ABNORMAL LOW (ref 150–400)
RBC: 3.6 MIL/uL — ABNORMAL LOW (ref 3.87–5.11)
WBC: 8.7 10*3/uL (ref 4.0–10.5)

## 2013-01-30 LAB — BIRTH TISSUE RECOVERY COLLECTION (PLACENTA DONATION)

## 2013-01-30 NOTE — Progress Notes (Signed)
Subjective: Postpartum Day 1 Cesarean Delivery Patient reports incisional pain, tolerating PO and + flatus.  Just had foley removed  Objective: Vital signs in last 24 hours: Temp:  [97.2 F (36.2 C)-98.7 F (37.1 C)] 98.1 F (36.7 C) (04/01 0430) Pulse Rate:  [59-81] 74 (04/01 0430) Resp:  [16-28] 18 (04/01 0430) BP: (85-116)/(36-83) 90/55 mmHg (04/01 0430) SpO2:  [94 %-100 %] 97 % (04/01 0430) Weight:  [62.596 kg (138 lb)] 62.596 kg (138 lb) (03/31 1215)  Physical Exam:  General: alert and cooperative Lochia: appropriate Uterine Fundus: firm Incision: C/D/I    Recent Labs  01/30/13 0630  HGB 11.4*  HCT 33.4*    Assessment/Plan: Status post Cesarean section. Doing well postoperatively.  Continue current care.  Oliver Pila 01/30/2013, 8:56 AM

## 2013-01-31 MED ORDER — SILVER SULFADIAZINE 1 % EX CREA
TOPICAL_CREAM | Freq: Two times a day (BID) | CUTANEOUS | Status: DC
  Administered 2013-01-31 – 2013-02-01 (×3): via TOPICAL
  Filled 2013-01-31: qty 85

## 2013-01-31 NOTE — Progress Notes (Signed)
Subjective: Postpartum Day 2: Cesarean Delivery Patient reports tolerating PO and no problems voiding.  She was uncomfortable and has a blister at the edge of her dressing on right.  She is not yet passing flatus  Objective: Vital signs in last 24 hours: Temp:  [97.9 F (36.6 C)-98.1 F (36.7 C)] 97.9 F (36.6 C) (04/02 0559) Pulse Rate:  [59-63] 63 (04/02 0559) Resp:  [18] 18 (04/02 0559) BP: (88-90)/(47-51) 90/47 mmHg (04/02 0559) SpO2:  [95 %] 95 % (04/01 1803)  Physical Exam:  General: alert and cooperative Lochia: appropriate Uterine Fundus: firm Incision: healing well, blister measuring 3-4 cm at upper aspect of dressing and another forming at the other side--dressing removed    Recent Labs  01/30/13 0630  HGB 11.4*  HCT 33.4*    Assessment/Plan: Status post Cesarean section. Doing well postoperatively.  Continue current care.  Will leave dressing off due to blistering.  Silvadene cream to area.  Plan d/c tomorrow  Oliver Pila 01/31/2013, 9:04 AM

## 2013-01-31 NOTE — Discharge Summary (Signed)
Obstetric Discharge Summary Reason for Admission: cesarean section Prenatal Procedures: none Intrapartum Procedures: cesarean: low cervical, transverse Postpartum Procedures: none Complications-Operative and Postpartum: blistering at incision site where dressing was Hemoglobin  Date Value Range Status  01/30/2013 11.4* 12.0 - 15.0 g/dL Final     HCT  Date Value Range Status  01/30/2013 33.4* 36.0 - 46.0 % Final    Physical Exam:  General: alert and cooperative Lochia: appropriate Uterine Fundus: firm Incision: healing well, blistering healing with silvadene   Discharge Diagnoses: Term Pregnancy-delivered  Discharge Information: Date: 01/31/2013 Activity: pelvic rest Diet: routine Medications: Ibuprofen and Percocet Condition: improved Instructions: refer to practice specific booklet Discharge to: home Follow-up Information   Schedule an appointment as soon as possible for a visit with Oliver Pila, MD. (incision check)    Contact information:   [address], [address] [place] [address] [phone]       Newborn Data: Live born female  Birth Weight: 8 lb 0.8 oz (3650 g) APGAR: 7, 9  Home with mother.  Oliver Pila 01/31/2013, 9:06 AM

## 2013-02-01 MED ORDER — OXYCODONE-ACETAMINOPHEN 5-325 MG PO TABS: 1.0000 | ORAL_TABLET | ORAL | Status: DC | PRN

## 2013-02-01 MED ORDER — IBUPROFEN 600 MG PO TABS: 600.0000 mg | ORAL_TABLET | Freq: Four times a day (QID) | ORAL | Status: DC | PRN

## 2013-02-01 MED ORDER — SILVER SULFADIAZINE 1 % EX CREA: TOPICAL_CREAM | Freq: Two times a day (BID) | CUTANEOUS | Status: DC

## 2013-02-01 MED ORDER — BISACODYL 10 MG RE SUPP
10.0000 mg | Freq: Once | RECTAL | Status: AC
  Administered 2013-02-01: 10 mg via RECTAL
  Filled 2013-02-01: qty 1

## 2013-02-01 NOTE — Progress Notes (Signed)
Subjective: Postpartum Day 3 Cesarean Delivery Patient reports tolerating PO and no problems voiding.  No flatus yet, uncomfortable  Objective: Vital signs in last 24 hours: Temp:  [98.5 F (36.9 C)-98.8 F (37.1 C)] 98.5 F (36.9 C) (04/03 0544) Pulse Rate:  [56-69] 56 (04/03 0544) Resp:  [18] 18 (04/03 0544) BP: (95-108)/(50-69) 95/50 mmHg (04/03 0544)  Physical Exam:  General: alert and cooperative Lochia: appropriate Uterine Fundus: firm Incision: C/D/I   Recent Labs  01/30/13 0630  HGB 11.4*  HCT 33.4*    Assessment/Plan: Status post Cesarean section. Doing well postoperatively. SLow flatus, will try a dulcolax suppository Discharge home with standard precautions and return to clinic in 2 weeks for incision check.  Rose [name] 02/01/2013, 10:22 AM

## 2013-10-09 DIAGNOSIS — Z8489 Family history of other specified conditions: Secondary | ICD-10-CM | POA: Insufficient documentation

## 2013-10-09 DIAGNOSIS — Z8481 Family history of carrier of genetic disease: Secondary | ICD-10-CM | POA: Insufficient documentation

## 2014-09-02 ENCOUNTER — Encounter (HOSPITAL_COMMUNITY): Payer: Self-pay | Admitting: Obstetrics and Gynecology

## 2016-02-03 DIAGNOSIS — R200 Anesthesia of skin: Secondary | ICD-10-CM | POA: Insufficient documentation

## 2016-12-31 DIAGNOSIS — I8390 Asymptomatic varicose veins of unspecified lower extremity: Secondary | ICD-10-CM | POA: Diagnosis not present

## 2016-12-31 DIAGNOSIS — M79646 Pain in unspecified finger(s): Secondary | ICD-10-CM | POA: Diagnosis not present

## 2016-12-31 DIAGNOSIS — Z0000 Encounter for general adult medical examination without abnormal findings: Secondary | ICD-10-CM | POA: Diagnosis not present

## 2016-12-31 DIAGNOSIS — R002 Palpitations: Secondary | ICD-10-CM | POA: Diagnosis not present

## 2017-01-03 DIAGNOSIS — J029 Acute pharyngitis, unspecified: Secondary | ICD-10-CM | POA: Diagnosis not present

## 2017-01-04 DIAGNOSIS — I87323 Chronic venous hypertension (idiopathic) with inflammation of bilateral lower extremity: Secondary | ICD-10-CM | POA: Diagnosis not present

## 2017-01-04 DIAGNOSIS — M79604 Pain in right leg: Secondary | ICD-10-CM | POA: Diagnosis not present

## 2017-01-04 DIAGNOSIS — M79605 Pain in left leg: Secondary | ICD-10-CM | POA: Diagnosis not present

## 2017-01-20 DIAGNOSIS — M79605 Pain in left leg: Secondary | ICD-10-CM | POA: Diagnosis not present

## 2017-01-20 DIAGNOSIS — M79604 Pain in right leg: Secondary | ICD-10-CM | POA: Diagnosis not present

## 2017-06-27 DIAGNOSIS — Z01419 Encounter for gynecological examination (general) (routine) without abnormal findings: Secondary | ICD-10-CM | POA: Diagnosis not present

## 2017-07-19 DIAGNOSIS — H1045 Other chronic allergic conjunctivitis: Secondary | ICD-10-CM | POA: Diagnosis not present

## 2017-09-10 DIAGNOSIS — J189 Pneumonia, unspecified organism: Secondary | ICD-10-CM | POA: Diagnosis not present

## 2017-09-10 DIAGNOSIS — R002 Palpitations: Secondary | ICD-10-CM | POA: Diagnosis not present

## 2017-09-12 ENCOUNTER — Telehealth (HOSPITAL_COMMUNITY): Payer: Self-pay | Admitting: Internal Medicine

## 2017-09-12 NOTE — Telephone Encounter (Signed)
Received a message from [place] that patient returned my call and stated that she had the echo done at urgent care.

## 2017-09-13 ENCOUNTER — Emergency Department (HOSPITAL_COMMUNITY)
Admission: EM | Admit: 2017-09-13 | Discharge: 2017-09-13 | Disposition: A | Discharge status: Home / Self Care | Payer: 59 | Attending: Emergency Medicine | Admitting: Emergency Medicine

## 2017-09-13 ENCOUNTER — Other Ambulatory Visit: Payer: Self-pay

## 2017-09-13 ENCOUNTER — Other Ambulatory Visit: Payer: Self-pay | Admitting: Internal Medicine

## 2017-09-13 ENCOUNTER — Encounter (HOSPITAL_COMMUNITY): Payer: Self-pay | Admitting: Emergency Medicine

## 2017-09-13 DIAGNOSIS — R9431 Abnormal electrocardiogram [ECG] [EKG]: Secondary | ICD-10-CM | POA: Diagnosis not present

## 2017-09-13 DIAGNOSIS — R002 Palpitations: Secondary | ICD-10-CM

## 2017-09-13 DIAGNOSIS — R531 Weakness: Secondary | ICD-10-CM | POA: Insufficient documentation

## 2017-09-13 DIAGNOSIS — R5383 Other fatigue: Secondary | ICD-10-CM | POA: Insufficient documentation

## 2017-09-13 DIAGNOSIS — Z5321 Procedure and treatment not carried out due to patient leaving prior to being seen by health care provider: Secondary | ICD-10-CM | POA: Diagnosis not present

## 2017-09-13 DIAGNOSIS — R0602 Shortness of breath: Secondary | ICD-10-CM | POA: Diagnosis not present

## 2017-09-13 LAB — COMPREHENSIVE METABOLIC PANEL
ALK PHOS: 66 U/L (ref 38–126)
ALT: 15 U/L (ref 14–54)
ANION GAP: 6 (ref 5–15)
AST: 23 U/L (ref 15–41)
Albumin: 4.1 g/dL (ref 3.5–5.0)
BILIRUBIN TOTAL: 0.5 mg/dL (ref 0.3–1.2)
BUN: 12 mg/dL (ref 6–20)
CALCIUM: 9.1 mg/dL (ref 8.9–10.3)
CO2: 26 mmol/L (ref 22–32)
CREATININE: 0.66 mg/dL (ref 0.44–1.00)
Chloride: 105 mmol/L (ref 101–111)
GFR calc Af Amer: >60 mL/min (ref >60)
Glucose, Bld: 106 mg/dL — ABNORMAL HIGH (ref 65–99)
Potassium: 3.7 mmol/L (ref 3.5–5.1)
SODIUM: 137 mmol/L (ref 135–145)
TOTAL PROTEIN: 7.3 g/dL (ref 6.5–8.1)

## 2017-09-13 LAB — CBC WITH DIFFERENTIAL/PLATELET
BASOS ABS: 0 10*3/uL (ref 0.0–0.1)
BASOS PCT: 0 %
EOS ABS: 0.1 10*3/uL (ref 0.0–0.7)
Eosinophils Relative: 1 %
HCT: 40.2 % (ref 36.0–46.0)
Hemoglobin: 13.6 g/dL (ref 12.0–15.0)
Lymphocytes Relative: 29 %
Lymphs Abs: 1.5 10*3/uL (ref 0.7–4.0)
MCH: 29.6 pg (ref 26.0–34.0)
MCHC: 33.8 g/dL (ref 30.0–36.0)
MCV: 87.6 fL (ref 78.0–100.0)
MONOS PCT: 6 %
Monocytes Absolute: 0.3 10*3/uL (ref 0.1–1.0)
NEUTROS PCT: 64 %
Neutro Abs: 3.3 10*3/uL (ref 1.7–7.7)
Platelets: 231 10*3/uL (ref 150–400)
RBC: 4.59 MIL/uL (ref 3.87–5.11)
RDW: 12.3 % (ref 11.5–15.5)
WBC: 5.2 10*3/uL (ref 4.0–10.5)

## 2017-09-13 LAB — I-STAT CG4 LACTIC ACID, ED: Lactic Acid, Venous: 0.8 mmol/L (ref 0.5–1.9)

## 2017-09-13 NOTE — ED Triage Notes (Addendum)
Pt to ER for evaluation of persistent fatigue and weakness after recent diagnosis of pneumonia. Pt reports feeling short of breath, "like I just need some oxygen." states was sent here for further evaluation. Pt in NAD. Appears fatigued. VSS. States has been on azithromycin for one week and states "expected to see some improvement."

## 2017-09-14 ENCOUNTER — Ambulatory Visit (INDEPENDENT_AMBULATORY_CARE_PROVIDER_SITE_OTHER): Payer: 59

## 2017-09-14 DIAGNOSIS — R002 Palpitations: Secondary | ICD-10-CM | POA: Diagnosis not present

## 2017-09-16 DIAGNOSIS — R002 Palpitations: Secondary | ICD-10-CM | POA: Diagnosis not present

## 2017-09-20 DIAGNOSIS — R002 Palpitations: Secondary | ICD-10-CM | POA: Diagnosis not present

## 2017-09-20 DIAGNOSIS — J189 Pneumonia, unspecified organism: Secondary | ICD-10-CM | POA: Diagnosis not present

## 2017-12-01 ENCOUNTER — Encounter: Payer: Self-pay | Admitting: Cardiology

## 2017-12-08 NOTE — Progress Notes (Signed)
Cardiology Office Note   Date:  12/09/2017   ID:  VENETTA KNEE, DOB 1981-03-07, MRN [id number]  PCP:  Shon Baton, MD  Cardiologist:   No primary care provider on file. Referring:  Shon Baton, MD  Chief Complaint  Patient presents with  . New Patient (Initial Visit)      History of Present Illness: Rose [name] is a 37 y.o. female who is referred by Shon Baton, MD for evaluation of palpitations.  The patient had a history of atrial valve prolapse as a child with some tachycardia.  However, follow-up echo in 2010 which I reviewed demonstrated no mitral valve prolapse or regurgitation.  He is coming in today complaining of palpitations.  She notices these really since she had some pneumonia in November.  She was more short of breath at that point.  Her heart was racing when she would try to do things.  She has a very involved life.  She has 2 children with her 88-year-old being totally handicapped and needing 24 7 in house care.  She also works full-time.  Her husband has 2 jobs.  He have a 81-year-old as well.  They are considering having another baby.  She says she notices palpitations sometimes at night.  She feels like her heart will skip or stop.  These are isolated and she does not get any presyncope or syncope.  She does not have any chest pressure, neck or arm discomfort.  Previously been active though she does not have time to exercise now.  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  With this activity she can bring on any symptoms.  She avoids caffeine the most part.  She wore an event monitor last fall and was noted to have PVCs.    Past Medical History:  Diagnosis Date  . Palpitations     Past Surgical History:  Procedure Laterality Date  . BACK SURGERY     x 2  . CESAREAN SECTION N/A 01/29/2013   Procedure: Primary cesarean section with delivery of baby ;  Surgeon: Logan Bores, MD;  Location: [facility];  Service: Obstetrics;  Laterality: N/A;  Primary  .  TONSILLECTOMY       No current outpatient medications on file.   No current facility-administered medications for this visit.     Allergies:   Tape and Codeine    Social History:  The patient  reports that  has never smoked. she has never used smokeless tobacco. She reports that she does not drink alcohol or use drugs.   Family History:  The patient's family history includes Heart disease in her father.    ROS:  Please see the history of present illness.   Otherwise, review of systems are positive for none.   All other systems are reviewed and negative.    PHYSICAL EXAM: VS:  BP (!) 90/57   Pulse 76   Ht 5\' 7"  (1.702 m)   Wt 119 lb 3.2 oz (54.1 kg)   BMI 18.67 kg/m  , BMI Body mass index is 18.67 kg/m. GENERAL:  Well appearing HEENT:  Pupils equal round and reactive, fundi not visualized, oral mucosa unremarkable NECK:  No jugular venous distention, waveform within normal limits, carotid upstroke brisk and symmetric, no bruits, no thyromegaly LYMPHATICS:  No cervical, inguinal adenopathy LUNGS:  Clear to auscultation bilaterally BACK:  No CVA tenderness CHEST:  Unremarkable HEART:  PMI not displaced or sustained,S1 and S2 within normal limits, no  S3, no S4, no clicks, no rubs, no murmurs ABD:  Flat, positive bowel sounds normal in frequency in pitch, no bruits, no rebound, no guarding, no midline pulsatile mass, no hepatomegaly, no splenomegaly EXT:  2 plus pulses throughout, no edema, no cyanosis no clubbing SKIN:  No rashes no nodules NEURO:  Cranial nerves II through XII grossly intact, motor grossly intact throughout PSYCH:  Cognitively intact, oriented to person place and time    EKG:  EKG is not ordered today. The ekg ordered 09/13/18 demonstrates sinus rhythm, rate 88, axis within normal limits, intervals within normal limits, no acute ST-T wave changes.   Recent Labs: 09/13/2017: ALT 15; BUN 12; Creatinine, Ser 0.66; Hemoglobin 13.6; Platelets 231; Potassium  3.7; Sodium 137    Lipid Panel No results found for: CHOL, TRIG, HDL, CHOLHDL, VLDL, LDLCALC, LDLDIRECT    Wt Readings from Last 3 Encounters:  12/09/17 119 lb 3.2 oz (54.1 kg)  01/29/13 138 lb (62.6 kg)  01/26/13 138 lb (62.6 kg)      Other studies Reviewed: Additional studies/ records that were reviewed today include: Office records.  . Review of the above records demonstrates:  Please see elsewhere in the note.     ASSESSMENT AND PLAN:  PALPITATIONS: The patient has premature ventricular contractions.  He had a normal echo several years ago.  She had normal labs to include electrolytes and thyroid recently.  At this point I think she is feeling the PVCs.  Certainly stress and her workload would contribute to this and we talked at great length about that.  At this point I do not think there is any malignant dysrhythmia.  I do not suspect structural heart disease.  Given this she would not want to take any therapy for symptomatic relief only.  MVP: We talked about this diagnosis.  I do not hear evidence of mitral valve prolapse on today's exam.  We discussed the physiology of this.  I do not think further imaging is indicated.   Current medicines are reviewed at length with the patient today.  The patient does not have concerns regarding medicines.  The following changes have been made:  no change  Labs/ tests ordered today include: None No orders of the defined types were placed in this encounter.    Disposition:   FU with me as needed.     Signed, Minus Breeding, MD  12/09/2017 1:01 PM    [place]

## 2017-12-09 ENCOUNTER — Encounter: Payer: Self-pay | Admitting: Cardiology

## 2017-12-09 ENCOUNTER — Ambulatory Visit (INDEPENDENT_AMBULATORY_CARE_PROVIDER_SITE_OTHER): Payer: 59 | Admitting: Cardiology

## 2017-12-09 VITALS — BP 90/57 | HR 76 | Ht 67.0 in | Wt 119.2 lb

## 2017-12-09 DIAGNOSIS — R002 Palpitations: Secondary | ICD-10-CM

## 2017-12-09 NOTE — Patient Instructions (Signed)
Medication Instructions:  Continue current medications  If you need a refill on your cardiac medications before your next appointment, please call your pharmacy.  Labwork: None Ordered  Testing/Procedures: None Ordered  Follow-Up: Your physician wants you to follow-up in: As Needed.      Thank you for choosing [facility]!!       

## 2017-12-12 DIAGNOSIS — J069 Acute upper respiratory infection, unspecified: Secondary | ICD-10-CM | POA: Diagnosis not present

## 2017-12-20 ENCOUNTER — Encounter (HOSPITAL_COMMUNITY): Payer: Self-pay

## 2017-12-20 ENCOUNTER — Ambulatory Visit (HOSPITAL_COMMUNITY)
Admission: RE | Admit: 2017-12-20 | Discharge: 2017-12-20 | Disposition: A | Discharge status: Home / Self Care | Payer: 59 | Source: Ambulatory Visit | Attending: Obstetrics and Gynecology | Admitting: Obstetrics and Gynecology

## 2017-12-20 DIAGNOSIS — Z818 Family history of other mental and behavioral disorders: Secondary | ICD-10-CM | POA: Insufficient documentation

## 2017-12-20 DIAGNOSIS — Z315 Encounter for genetic counseling: Secondary | ICD-10-CM | POA: Insufficient documentation

## 2017-12-20 DIAGNOSIS — Z820 Family history of epilepsy and other diseases of the nervous system: Secondary | ICD-10-CM | POA: Diagnosis not present

## 2017-12-20 NOTE — Progress Notes (Signed)
Pt here today in [place] to see Dr. Arnoldo Lenis for preconcept.  LMP approximately 3 weeks ago (11-2017).  BP 104/62, HR 84. Wt. 120.2lbs.

## 2017-12-26 ENCOUNTER — Encounter (HOSPITAL_COMMUNITY): Payer: Self-pay

## 2017-12-26 DIAGNOSIS — Z818 Family history of other mental and behavioral disorders: Secondary | ICD-10-CM | POA: Insufficient documentation

## 2017-12-26 DIAGNOSIS — Z820 Family history of epilepsy and other diseases of the nervous system: Secondary | ICD-10-CM | POA: Insufficient documentation

## 2017-12-26 NOTE — Progress Notes (Signed)
Genetic Counseling  Preconception Note  Appointment Date:  12/20/2017 Referred By: Paula Compton, MD Date of Birth:  Jul 01, 1981 Partner:  Sharmon Leyden   Pregnancy History: [id number]  I met with Mrs. 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  In summary:  Discussed family history of Rett syndrome  Daughter has MECP2 pathogenic variant identified (M355H)  Mrs. Katen has had negative peripheral blood testing for same MECP2 variant  Reviewed X-linked inheritance and majority de novo occurrence of Rett syndrome  Patient understands recurrence risk for future pregnancy is likely <1%, given that gonadal mosaicism cannot be ruled out  Discussed options of screening / testing for MECP2 variants in future pregnancy  PGT-M: patient not interested in pursuing IVF/preimplantation genetic testing for MECP2 at this time  [facility]- patient would likely consider this testing in future pregnancy  Amniocentesis  Single gene NIPT ([facility])- includes MECP2 in analysis but we discussed this is screening, not diagnostic- available starting at [redacted] week gestation  Reviewed benefits and limitations of genetic testing for patient's husband  Given X-linked inheritance of Rett syndrome and that he is asymptomatic, peripheral blood testing of MECP2 would be expected to be within normal limits  Sperm analysis for gonadal mosaicism- patient declined at this time, given that they would likely pursue diagnostic testing in future pregnancy regardless of these results  Discussed general population carrier screening options  CF  SMA  Hemoglobinopathies  Expanded pan-ethnic carrier screening- Patient declined labwork today; She plans to further discuss this screening option with partner and will contact our office if they desire to pursue this  Discussed associations with  increasing maternal age and risk for fetal aneuploidy due to nondisjunction  Patient understands this cannot be screened for preconceptionally  Reviewed available screening and testing including NIPS for aneuploidy, CVS, amniocentesis, and detailed ultrasound  We began by reviewing the family history in detail. Mrs. Budreau reported that she and her partner have a 48 year old daughter, Ralph Leyden, with Rett syndrome. She has been evaluated and followed through [facility]. 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 Mrs. Donavan reported that she then personally was tested for the same pathogenic variant on peripheral blood, which was negative. Mrs. Hulsey provided medical documentation of her daughter's molecular testing today. Mrs. Haser reported that the couple's younger daughter is currently age 37 years old and is healthy with typical development. She has not had testing for Rett syndrome, but she is reported to be asymptomatic for the condition. Mr. Pounds did not have testing for the MECP2 mutation, but he is reportedly healthy. Mrs. Caton is currently 37 y.o., and her husband is currently 47 years old. They both reportedly have Caucasian ancestry with no known Ashkenazi Jewish ancestry and no known consanguinity.   Rett syndrome is an X-linked dominant neurodevelopmental disorder which predominantly affects females. Rarely, males with Rett syndrome have been reported with somatic mosaicism or extra X chromosomes. This condition is most often caused by mutations in the MECP2 gene on the X-chromosome (Xq28), often referred to as Classic Rett syndrome. Less commonly, other variant forms of Rett syndrome are seen which are caused by different genes and in some cases follow a different pattern of inheritance.   Rett syndrome is seen  in approximately 1  in 37,500 live female births. Characteristics of Rett syndrome include arrested development between approximately 6 and 33 months of age with regression of acquired skills, loss of speech, stereotypic movements (including loss of purposeful hand movements leading to hand wringing), progressive microcephaly, seizures, and intellectual disability. Females with Rett syndrome are described to subsequently lose the ability to ambulate.    The vast majority of cases of Rett syndrome due to MECP2 mutations are sporadic (de novo). Familial recurrence of Rett syndrome has been reported in rare cases. The rare cases of familial recurrence have been associated with cases of skewed X inactivation for the mother who carries a MECP2 mutation, or due to underlying parental gonadal mosaicism. We reviewed that given the reported family history, recurrence risk for Rett syndrome in the current pregnancy is likely low, estimated to be less than 1%. However, recurrence cannot be ruled out given that gonadal mosaicism cannot be ruled out. We discussed benefits and limitations to MECP2 testing for Mr. Upton. Given that he is reportedly healthy with no symptoms suggestive of Rett syndrome and no reported symptoms that would increase suspicion for an underlying additional X chromosome for him, peripheral blood testing for the pathogenic MECP2 variant in the family would most likely be expected to be within normal limits. We discussed that sperm analysis is available to assess for gonadal mosaicism for him, and that we could investigate how this could be facilitated, if desired. In the unlikely case that gonadal mosaicism was detected for Mr. Ratto, recurrence risk could be up to 50%. The patient understands that the most likely recurrence risk for Rett syndrome in a future pregnancy is less than 1%.   We reviewed that prenatal diagnosis for Rett syndrome is available in future pregnancy via chorionic villus sampling  (CVS) at approximately 10-[redacted] weeks gestation or amniocentesis (after [redacted] weeks gestation) for the identified MECP2 pathogenic variant in the family. The risks, benefits, and limitations of CVS and amniocentesis were reviewed including the associated 1 in 440 risk for complications, including spontaneous pregnancy loss for CVS and the associated 1 in 102-725 risk for complications for amniocentesis, including spontaneous pregnancy loss.    We discussed that there is a newer noninvasive prenatal screening (NIPS)/prenatal cell free DNA testing platform ([facility] through [facility]) that is able to assess for mutations in a panel of 30 selected single genes, starting at [redacted] weeks gestation. We reviewed the methodology of prenatal cell free DNA screening. The conditions selected for this panel are autosomal dominant or X-linked conditions that typically occur due to de novo gene variations. We reviewed the methodology behind this screen and that the reported detection rate ranges from 43% to greater than 96%, depending upon the specific condition and specific gene. MECP2 is included on this panel. We reviewed possible results including positive, negative, unexpected findings, and no result. We reviewed limitations of the test including that it is not diagnostic, it relies on a high enough fetal fraction in the sample, and that if the patient carriers a mutation in one of the genes on the panel, analysis cannot be performed for the pregnancy. We also discussed that if a mutation is identified in the paternal sample, this will also be reported to the couple.   Preimplantation genetic testing would also be an option for a future pregnancy, meaning that embryo(s) obtained through in vitro fertilization would be tested for the familial pathogenic variant in MECP2 prior to the implantation of unaffected embryo(s), if present, into the uterus. We briefly discussed  risks, benefits, and limitations of PGT. Should the couple desire  additional information about this process, consultation with REI specialist is available. Mrs. Espaillat indicated that she was not interested in pursuing PGT and declined pursuing consultation for additional information about PGT at this time.   After careful consideration, Mrs. Arts indicated that should she and her partner elect to pursue a future pregnancy, she would likely pursue CVS for MECP2 analysis. Even though she is primarily interested in diagnostic testing regarding Rett syndrome, she indicated that she would also possibly be interested in single gene NIPS ([facility]), given that the additional conditions on the panel also typically occur sporadically and would not otherwise be routinely screened or testing for typically in pregnancy. Mrs. Futch indicated that given that she would likely elect to pursue prenatal diagnosis for MECP2 regardless of what additional testing for her husband for MECP2 showed, she indicated that she was less interested in pursuing additional testing for him for MECP2 at this time.   The family histories were otherwise found to be noncontributory for birth defects, intellectual disability, and known genetic conditions. Without further information regarding the provided family history, an accurate genetic risk cannot be calculated. Further genetic counseling is warranted if more information is obtained.     We reviewed other available options including detailed ultrasound and amniocentesis. We reviewed the approximate 1 in 671-245 risk for complications for amniocentesis, including spontaneous pregnancy loss. This couple understands that given the normal NIPS ([facility]) result, the risk of fetal aneuploidy is much lower than the risk of a complication related to diagnostic invasive testing. After consideration of these options, they elected to have a detailed ultrasound, but declined amniocentesis. A complete ultrasound was performed today. The ultrasound report will be  documented separately. There were no visualized fetal anomalies or markers suggestive of aneuploidy.  She was also counseled regarding maternal age and the association with risk for chromosome conditions due to nondisjunction with aging of the ova.   We reviewed chromosomes, nondisjunction, and the associated 1 in 111 risk for fetal aneuploidy related to a maternal age of 37 y.o. in pregnancy.  She was counseled that the risk for aneuploidy decreases as gestational age increases, accounting for those pregnancies which spontaneously abort.  We specifically discussed Down syndrome (trisomy 58), trisomies 78 and 12, and sex chromosome aneuploidies (47,XXX and 47,XXY) including the common features and prognoses of each.   We reviewed available screening options in a future pregnancy for fetal aneuploidy including First Screen, Quad screen, noninvasive prenatal screening (NIPS)/cell free DNA (cfDNA) screening, and detailed ultrasound.  She was counseled that screening tests are used to modify a patient's a priori risk for aneuploidy, typically based on age. This estimate provides a pregnancy specific risk assessment. We reviewed the benefits and limitations of each option. She was also counseled that chromosome analysis is available via CVS and amniocentesis. She understands that there are not preconception screens or tests that can assess for fetal aneuploidy.     Regarding additional available genetic screening for the couple, we discussed the option of expanded carrier screening. We discussed that each person is estimated to have 7-10 genes that do not work correctly, meaning that each person is estimated to be a carrier of 7-10 different genetic conditions. She was counseled that the majority of these conditions follow autosomal recessive inheritance. We reviewed that we each have two copies of all of our genes, one inherited from each parent. If one copy of the pair of genes is  changed in a way that causes it  not to function properly, but the other copy of the gene works, then a person is called a "carrier" for that condition. However, because the other copy of the gene works correctly, the person typically does not have any health problems related to the non-working gene(s). It is only when BOTH copies of the pair of genes do not work correctly that a person will have the condition and the related health problems.   There are a myriad of genetic disorders that occur more frequently in specific ethnic groups, those which can be traced to particular geographic locations. We discussed that although these genetic disorders are much more prevalent in specific ethnic groups, as families are becoming increasingly multiracial and multicultural, these conditions can occur in anyone from any race or ethnicity. For this reason, we discussed the availability of ethnic specific genetic carrier screening, professional society (ACOG) recommended carrier testing, and pan-ethnic carrier screening.   We reviewed that ACOG currently recommends that all reproductive patients be offered carrier screening for cystic fibrosis, spinal muscular atrophy and hemoglobinopathies. In addition, they were counseled that there are a variety of genetic screening laboratories that have pan-ethnic, or expanded, carrier screening panels, which evaluate carrier status for a wide range of genetic conditions. Some of these conditions are severe and actionable, but also rare; others occur more commonly, but are less severe. We discussed that testing options range from screening for a single condition to panels of more than 200 autosomal or X-linked genetic conditions. We reviewed that the prevalence of each condition varies (and often varies with ethnicity). Thus the couples' background risk to be a carrier for each of these various conditions would range, and in some cases be very low or unknown. Similarly, the detection rate varies with each condition  and also varies in some cases with ethnicity, ranging from greater than 99% (in the case of hemoglobinopathies) to unknown. We reviewed that a negative carrier screen would thus reduce, but not eliminate the chance to be a carrier for these conditions. For some conditions included on specific pan-ethnic carrier screening panels, the pre-test carrier frequency and/or the detection rate is unknown. Thus, for some conditions, the exact reduction of risk with a negative carrier screening result may not be able to be quantified. We reviewed that in the event that one partner is found to be a carrier for one or more conditions, carrier screening would be available to the partner for those conditions. Mrs. ABIGAL CHOUNG was provided with written information regarding pan-ethnic carrier screening option including a list of the conditions included on the screening panel. We discussed the risks, benefits, and limitations of carrier screening with the patient. Mrs. Kellett declined pursuing expanded carrier screening today but planned to further consider this option and discuss with her husband; She will contact our office should she elect to pursue this screening and if she desires our office to facilitate the screening for her.    I counseled Mrs. FLORICE HINDLE regarding the above risks and available options. Most of the counseling was provided by Carmin Richmond, [employer] genetic counseling student, under my direct supervision. The approximate face-to-face time with the genetic counselor was 50 minutes.  Chipper Oman, MS Insurance risk surveyor

## 2017-12-29 ENCOUNTER — Telehealth (HOSPITAL_COMMUNITY): Payer: Self-pay | Admitting: MS"

## 2017-12-29 NOTE — Telephone Encounter (Signed)
Returned patient's call. Left message stating that I received message that she would like to proceed with bloodwork we had discussed, which I am assuming is the expanded carrier screening blood work. Briefly stated in message that patient can call back to scheduled lab draw for carrier screening at her convenience. I understand that her husband's schedule can be trickier to accommodate and that typically we would screen her first, if negative, then wouldn't be indicated to screen him. If it is indicated to pursue carrier screening for her husband, we can either coordinate a date for lab draw, or he would also potentially have option of saliva collection kit being sent to him. Left number for patient to call back to schedule lab draw and/or to speak with me if she has additional questions or concerns.   Rose [name] 12/29/2017 10:50 AM

## 2018-01-13 ENCOUNTER — Ambulatory Visit (HOSPITAL_COMMUNITY): Payer: 59

## 2018-01-31 DIAGNOSIS — R102 Pelvic and perineal pain: Secondary | ICD-10-CM | POA: Diagnosis not present

## 2018-03-14 DIAGNOSIS — R82998 Other abnormal findings in urine: Secondary | ICD-10-CM | POA: Diagnosis not present

## 2018-03-14 DIAGNOSIS — Z0000 Encounter for general adult medical examination without abnormal findings: Secondary | ICD-10-CM | POA: Diagnosis not present

## 2018-03-15 DIAGNOSIS — M5136 Other intervertebral disc degeneration, lumbar region: Secondary | ICD-10-CM | POA: Diagnosis not present

## 2018-03-15 DIAGNOSIS — R5383 Other fatigue: Secondary | ICD-10-CM | POA: Diagnosis not present

## 2018-03-15 DIAGNOSIS — R000 Tachycardia, unspecified: Secondary | ICD-10-CM | POA: Diagnosis not present

## 2018-03-21 DIAGNOSIS — D229 Melanocytic nevi, unspecified: Secondary | ICD-10-CM | POA: Diagnosis not present

## 2018-03-21 DIAGNOSIS — Z0000 Encounter for general adult medical examination without abnormal findings: Secondary | ICD-10-CM | POA: Diagnosis not present

## 2018-03-21 DIAGNOSIS — Z1389 Encounter for screening for other disorder: Secondary | ICD-10-CM | POA: Diagnosis not present

## 2018-03-21 DIAGNOSIS — R002 Palpitations: Secondary | ICD-10-CM | POA: Diagnosis not present

## 2018-03-21 DIAGNOSIS — M79646 Pain in unspecified finger(s): Secondary | ICD-10-CM | POA: Diagnosis not present

## 2018-03-23 DIAGNOSIS — M9903 Segmental and somatic dysfunction of lumbar region: Secondary | ICD-10-CM | POA: Diagnosis not present

## 2018-03-23 DIAGNOSIS — M9905 Segmental and somatic dysfunction of pelvic region: Secondary | ICD-10-CM | POA: Diagnosis not present

## 2018-03-23 DIAGNOSIS — M5386 Other specified dorsopathies, lumbar region: Secondary | ICD-10-CM | POA: Diagnosis not present

## 2018-03-24 DIAGNOSIS — M5386 Other specified dorsopathies, lumbar region: Secondary | ICD-10-CM | POA: Diagnosis not present

## 2018-03-24 DIAGNOSIS — M9903 Segmental and somatic dysfunction of lumbar region: Secondary | ICD-10-CM | POA: Diagnosis not present

## 2018-03-24 DIAGNOSIS — M9905 Segmental and somatic dysfunction of pelvic region: Secondary | ICD-10-CM | POA: Diagnosis not present

## 2018-03-30 DIAGNOSIS — M9903 Segmental and somatic dysfunction of lumbar region: Secondary | ICD-10-CM | POA: Diagnosis not present

## 2018-03-30 DIAGNOSIS — M9905 Segmental and somatic dysfunction of pelvic region: Secondary | ICD-10-CM | POA: Diagnosis not present

## 2018-03-30 DIAGNOSIS — M5386 Other specified dorsopathies, lumbar region: Secondary | ICD-10-CM | POA: Diagnosis not present

## 2018-04-03 DIAGNOSIS — M9903 Segmental and somatic dysfunction of lumbar region: Secondary | ICD-10-CM | POA: Diagnosis not present

## 2018-04-03 DIAGNOSIS — M5386 Other specified dorsopathies, lumbar region: Secondary | ICD-10-CM | POA: Diagnosis not present

## 2018-04-03 DIAGNOSIS — M9905 Segmental and somatic dysfunction of pelvic region: Secondary | ICD-10-CM | POA: Diagnosis not present

## 2018-04-07 DIAGNOSIS — L2089 Other atopic dermatitis: Secondary | ICD-10-CM | POA: Diagnosis not present

## 2018-04-10 DIAGNOSIS — M5386 Other specified dorsopathies, lumbar region: Secondary | ICD-10-CM | POA: Diagnosis not present

## 2018-04-10 DIAGNOSIS — M9903 Segmental and somatic dysfunction of lumbar region: Secondary | ICD-10-CM | POA: Diagnosis not present

## 2018-04-10 DIAGNOSIS — M9905 Segmental and somatic dysfunction of pelvic region: Secondary | ICD-10-CM | POA: Diagnosis not present

## 2018-04-13 DIAGNOSIS — M9903 Segmental and somatic dysfunction of lumbar region: Secondary | ICD-10-CM | POA: Diagnosis not present

## 2018-04-13 DIAGNOSIS — M9905 Segmental and somatic dysfunction of pelvic region: Secondary | ICD-10-CM | POA: Diagnosis not present

## 2018-04-13 DIAGNOSIS — M5386 Other specified dorsopathies, lumbar region: Secondary | ICD-10-CM | POA: Diagnosis not present

## 2018-05-03 DIAGNOSIS — M9903 Segmental and somatic dysfunction of lumbar region: Secondary | ICD-10-CM | POA: Diagnosis not present

## 2018-05-03 DIAGNOSIS — M9905 Segmental and somatic dysfunction of pelvic region: Secondary | ICD-10-CM | POA: Diagnosis not present

## 2018-05-03 DIAGNOSIS — M5386 Other specified dorsopathies, lumbar region: Secondary | ICD-10-CM | POA: Diagnosis not present

## 2018-05-16 DIAGNOSIS — H00025 Hordeolum internum left lower eyelid: Secondary | ICD-10-CM | POA: Diagnosis not present

## 2018-06-21 ENCOUNTER — Telehealth: Payer: Self-pay | Admitting: Cardiology

## 2018-06-21 NOTE — Telephone Encounter (Signed)
Received call from patient c/o elevated HR.   She states for 2-3 days her HR has been elevated 120-130s.  She states today she has noticed she is not feeling well.  She states she is SOB, fatigue, denies CP.  Unable to take BP.  She states she has an apple watch that can do EKG and it says "inconclusive".   She states she normally has an elevated HR but she feels like "something is not right".  She states she doesn't seek care until she knows something is wrong.    Advised patient to proceed to ER for evaluation as there are not availabilities today and this would be appropriate in order to rule out acute issues.   Patient states she does not want to go to ER, but request appt.   Scheduled tomorrow 8/22 at 930am with Almyra Deforest PA.   Patient aware and verbalized understanding.

## 2018-06-21 NOTE — Telephone Encounter (Signed)
New Message    STAT if HR is under 50 or over 120 (normal HR is 60-100 beats per minute)  1) What is your heart rate? 140 beats per minute  2) Do you have a log of your heart rate readings (document readings)? Yes not available at the time of call  3) Do you have any other symptoms? Shortness of breath   Patient states that her heart rate most of the time between 97-170. It can be 120 and she is just sitting. She is feeling sob, fatigue and very winded even with going up a small flight of stairs.

## 2018-06-22 ENCOUNTER — Ambulatory Visit (INDEPENDENT_AMBULATORY_CARE_PROVIDER_SITE_OTHER): Payer: 59 | Admitting: Physician Assistant

## 2018-06-22 ENCOUNTER — Encounter: Payer: Self-pay | Admitting: Physician Assistant

## 2018-06-22 VITALS — BP 116/62 | HR 80 | Ht 67.0 in | Wt 118.0 lb

## 2018-06-22 DIAGNOSIS — R000 Tachycardia, unspecified: Secondary | ICD-10-CM

## 2018-06-22 NOTE — Patient Instructions (Signed)
Medication Instructions: Your physician recommends that you continue on your current medications as directed.    If you need a refill on your cardiac medications before your next appointment, please call your pharmacy.   Labwork: None  Procedures/Testing: None  Follow-Up: Your physician wants you to follow-up with an EP doctor. Someone will contact you.  Special Instructions:    Thank you for choosing [facility] at [facility]!!

## 2018-06-22 NOTE — Progress Notes (Signed)
Cardiology Office Note    Date:  06/24/2018   ID:  Rose [name], DOB 10-27-1981, MRN [id number]  PCP:  Shon Baton, MD  Cardiologist:  Dr. Percival Spanish  Chief Complaint  Patient presents with  . Follow-up    eval for tachycardia    History of Present Illness:  Rose [name] is a 37 y.o. female with past medical history of atrial valve prolapse and a tachycardia who was recently evaluated by Dr. Percival Spanish in February 2019 for palpitation.  She had a previous echocardiogram obtained on 10/15/2009 that showed EF 55 to 60%, no regional wall motion abnormality.  Event monitor obtained on 09/14/2017 showed normal sinus rhythm and a sinus tachycardia with heart rate ranges from 60-120.  Patient presents today for cardiology office visit.  She continued to have intermittent tachycardic episodes.  Yesterday, based on her apple watch, her heart rate ranged from the high 30s up to 170.  She says she was not exercising.  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  She says her symptom has become a lot worse since last year's pneumonia.  Orthostatic vital signs shows that stable blood pressure, but her heart rate increased by 20 bpm when changed from lying to standing.  Her normal TSH, I would not recommend any further work-up at this time.  She is concerned that there is something wrong with her as both her and her husband wants to have another child.  However she also mentions she is unlikely to be currently pregnant as she just went through her period 10 days ago and her symptom has been going on even prior to her last period.  She is concerned that her ferritin was borderline low recently, however outside record also shows her hemoglobin was 13 back in May 2019.  I doubt that her borderline low ferritin is capable causing her symptom.  On physical exam, I do not hear any obvious heart murmur to suggest valvular issue.  I do agree with Dr. Percival Spanish, repeating a  echocardiogram unlikely will benefit her.  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  Unfortunately, she did not have EKG strip at the time, therefore I am not sure if she was in bigeminy.   Past Medical History:  Diagnosis Date  . Palpitations     Past Surgical History:  Procedure Laterality Date  . BACK SURGERY     x 2  . BREAST SURGERY    . CESAREAN SECTION N/A 01/29/2013   Procedure: Primary cesarean section with delivery of baby ;  Surgeon: Logan Bores, MD;  Location: [facility];  Service: Obstetrics;  Laterality: N/A;  Primary  . TONSILLECTOMY      Current Medications: Outpatient Medications Prior to Visit  Medication Sig Dispense Refill  . Ferrous Fumarate (IRON) 18 MG TBCR Take 18 mg by mouth daily. Take 1 tablet by mouth daily    . lactobacillus acidophilus (BACID) TABS tablet Take 2 tablets by mouth 3 (three) times daily.    . Multiple Vitamin (MULTIVITAMIN) capsule Take 1 capsule by mouth daily.    . Omega-3 Fatty Acids (FISH OIL PO) Take by mouth.     No facility-administered medications prior to visit.      Allergies:   Tape and Codeine  Social History   Socioeconomic History  . Marital status: Married    Spouse name: Not on file  . Number of children: Not on file  . Years of education: Not on file  . Highest education level: Not on file  Occupational History  . Occupation: Hair Dresser  Social Needs  . Financial resource strain: Not on file  . Food insecurity:    Worry: Not on file    Inability: Not on file  . Transportation needs:    Medical: Not on file    Non-medical: Not on file  Tobacco Use  . Smoking status: Never Smoker  . Smokeless tobacco: Never Used  Substance and Sexual Activity  . Alcohol use: No  . Drug use: No  . Sexual activity: Yes    Birth control/protection: None    Lifestyle  . Physical activity:    Days per week: Not on file    Minutes per session: Not on file  . Stress: Not on file  Relationships  . Social connections:    Talks on phone: Not on file    Gets together: Not on file    Attends religious service: Not on file    Active member of club or organization: Not on file    Attends meetings of clubs or organizations: Not on file    Relationship status: Not on file  Other Topics Concern  . Not on file  Social History Narrative   Lives at home with children and husband.      Family History:  The patient's family history includes Heart disease in her father; Other in her daughter.   ROS:   Please see the history of present illness.    ROS All other systems reviewed and are negative.   PHYSICAL EXAM:   VS:  BP 116/62   Pulse 80   Ht 5\' 7"  (1.702 m)   Wt 118 lb (53.5 kg)   LMP 06/12/2018 (Exact Date)   BMI 18.48 kg/m    GEN: Well nourished, well developed, in no acute distress  HEENT: normal  Neck: no JVD, carotid bruits, or masses Cardiac: RRR; no murmurs, rubs, or gallops,no edema  Respiratory:  clear to auscultation bilaterally, normal work of breathing GI: soft, nontender, nondistended, + BS MS: no deformity or atrophy  Skin: warm and dry, no rash Neuro:  Alert and Oriented x 3, Strength and sensation are intact Psych: euthymic mood, full affect  Wt Readings from Last 3 Encounters:  06/22/18 118 lb (53.5 kg)  12/09/17 119 lb 3.2 oz (54.1 kg)  01/29/13 138 lb (62.6 kg)      Studies/Labs Reviewed:   EKG:  EKG is ordered today.  The ekg ordered today demonstrates normal sinus rhythm, no significant ST-T wave changes  Recent Labs: 09/13/2017: ALT 15; BUN 12; Creatinine, Ser 0.66; Hemoglobin 13.6; Platelets 231; Potassium 3.7; Sodium 137   Lipid Panel No results found for: CHOL, TRIG, HDL, CHOLHDL, VLDL, LDLCALC, LDLDIRECT  Additional studies/ records that were reviewed today include:   Echo 10/15/2009 Study  Conclusions Left ventricle: Systolic function was normal. The estimated ejection fraction was in the range of 55% to 60%. Wall motion was normal; there were no regional wall motion abnormalities. Impressions:  - Normal study.   Heart Monitor 09/14/2017 Study Highlights    Normal sinus rhythm and sinus tachycardia with heart rate range from 60 to 120bpm.  Occasional PVC     ASSESSMENT:    1. Tachycardia  PLAN:  In order of problems listed above:  1. Tachycardia: Patient's current heart rate is in the 80s, however according to her apple watch, her heart rate stays around 100 or more during work.  She is a hairdresser and stay on her feet the majority of the day.  Orthostatic vital signs are obtained today showed her heart rate increased by 20 bpm after changing from lying position to standing position.  She says most of her symptoms started after her most recent pneumonia episode.  Although both her and her husband wish to have another child, however her symptom started even before her last period 10 days ago, therefore not attribute her symptoms to possible pregnancy.  She does not drink caffeine.  Her last TSH was normal.  On physical exam, she does not have significant heart murmur to suggest severe valvular issue.  Previous echo in December 2010 was normal without congenital heart issue.  I am hesitant to use medication to suppress her heart rate previous heart monitor only showed sinus rhythm and sinus tachycardia with occasional PVCs.  We discussed various options, she is agreeable for EP evaluation for atrial tachycardia.    Medication Adjustments/Labs and Tests Ordered: Current medicines are reviewed at length with the patient today.  Concerns regarding medicines are outlined above.  Medication changes, Labs and Tests ordered today are listed in the Patient Instructions below. Patient Instructions  Medication Instructions: Your physician recommends that you continue  on your current medications as directed.    If you need a refill on your cardiac medications before your next appointment, please call your pharmacy.   Labwork: None  Procedures/Testing: None  Follow-Up: Your physician wants you to follow-up with an EP doctor. Someone will contact you.  Special Instructions:    Thank you for choosing [facility] at [facility], [place]  06/24/2018 8:04 PM    [address], [place], [place]  [address] Phone: [phone]; Fax: [phone]

## 2018-06-24 ENCOUNTER — Encounter: Payer: Self-pay | Admitting: Physician Assistant

## 2018-07-17 ENCOUNTER — Ambulatory Visit (INDEPENDENT_AMBULATORY_CARE_PROVIDER_SITE_OTHER): Payer: 59 | Admitting: Cardiology

## 2018-07-17 ENCOUNTER — Encounter: Payer: Self-pay | Admitting: Cardiology

## 2018-07-17 VITALS — BP 120/58 | HR 105 | Ht 67.0 in | Wt 119.0 lb

## 2018-07-17 DIAGNOSIS — R000 Tachycardia, unspecified: Secondary | ICD-10-CM

## 2018-07-17 NOTE — Patient Instructions (Signed)
Medication Instructions:  Your physician recommends that you continue on your current medications as directed. Please refer to the Current Medication list given to you today.  * If you need a refill on your cardiac medications before your next appointment, please call your pharmacy.   Labwork: None ordered *We will only notify you of abnormal results, otherwise continue current treatment plan.  Testing/Procedures: None ordered  Follow-Up: Your physician recommends that you schedule a follow-up appointment in: 3 months with Dr. Curt Bears.  *Please note that any paperwork needing to be filled out by the provider will need to be addressed at the front desk prior to seeing the provider. Please note that any FMLA, disability or other documents regarding health condition is subject to a $25.00 charge that must be received prior to completion of paperwork in the form of a money order or check.  Thank you for choosing [facility]!!   Trinidad Curet, RN [phone]  Any Other Special Instructions Will Be Listed Below (If Applicable).

## 2018-07-17 NOTE — Progress Notes (Signed)
Electrophysiology Office Note   Date:  07/17/2018   ID:  Rose [name], DOB 05-17-1981, MRN [id number]  PCP:  Shon Baton, MD  Cardiologist:  [place] Primary Electrophysiologist:  Latonya Nelon Meredith Leeds, MD    No chief complaint on file.    History of Present Illness: Rose [name] is a 37 y.o. female who is being seen today for the evaluation of palpitations at the request of Almyra Deforest. Presenting today for electrophysiology evaluation.  She has a history of mitral valve prolapse and tachycardia.  Echo in 2010 showed a normal ejection fraction.  Monitor 2018 showed sinus rhythm and sinus tachycardia with heart rates of 60-120.  She has intermittent episodes of tachycardia.  Her heart rates on her apple watch range from the 30s to the 170s.  This is not during exercise.  She had pneumonia last year and her symptoms became much worse at that time.  Orthostatic vitals show stable blood pressure, but a heart rate increase by 20 bpm when going from lying to standing.  She does have a history of PVCs, and thus it is possible that her bradycardia could be related to ventricular bigeminy.   Today, she denies symptoms of palpitations, chest pain, shortness of breath, orthopnea, PND, lower extremity edema, claudication, dizziness, presyncope, syncope, bleeding, or neurologic sequela. The patient is tolerating medications without difficulties.  Her symptoms occur at random times.  They mainly occur when she is exerting herself such as going up and down stairs.  Her heart rate gets into the 140s at times.  There also times that she feels well.  She continues to be able to exercise and gets dizzy with certain exercises such as mountain climber's and Burpee's.   Past Medical History:  Diagnosis Date  . Palpitations    Past Surgical History:  Procedure Laterality Date  . BACK SURGERY     x 2  . BREAST SURGERY    . CESAREAN SECTION N/A 01/29/2013   Procedure: Primary cesarean section with delivery  of baby ;  Surgeon: Logan Bores, MD;  Location: [facility];  Service: Obstetrics;  Laterality: N/A;  Primary  . TONSILLECTOMY       Current Outpatient Medications  Medication Sig Dispense Refill  . Ferrous Fumarate (IRON) 18 MG TBCR Take 18 mg by mouth daily. Take 1 tablet by mouth daily    . lactobacillus acidophilus (BACID) TABS tablet Take 2 tablets by mouth 3 (three) times daily.    . Multiple Vitamin (MULTIVITAMIN) capsule Take 1 capsule by mouth daily.    . Omega-3 Fatty Acids (FISH OIL PO) Take by mouth.     No current facility-administered medications for this visit.     Allergies:   Tape and Codeine   Social History:  The patient  reports that she has never smoked. She has never used smokeless tobacco. She reports that she does not drink alcohol or use drugs.   Family History:  The patient's family history includes Heart disease in her father; Other in her daughter.    ROS:  Please see the history of present illness.   Otherwise, review of systems is positive for fatigue, shortness of breath, palpitations, dizziness.   All other systems are reviewed and negative.    PHYSICAL EXAM: VS:  BP (!) 120/58   Pulse (!) 105   Ht 5\' 7"  (1.702 m)   Wt 119 lb (54 kg)   SpO2 98%   BMI 18.64 kg/m  , BMI Body mass  index is 18.64 kg/m. GEN: Well nourished, well developed, in no acute distress  HEENT: normal  Neck: no JVD, carotid bruits, or masses Cardiac: RRR; no murmurs, rubs, or gallops,no edema  Respiratory:  clear to auscultation bilaterally, normal work of breathing GI: soft, nontender, nondistended, + BS MS: no deformity or atrophy  Skin: warm and dry Neuro:  Strength and sensation are intact Psych: euthymic mood, full affect  EKG:  EKG is not ordered today. Personal review of the ekg ordered 06/22/18 shows sinus rhythm, rate 80   Recent Labs: 09/13/2017: ALT 15; BUN 12; Creatinine, Ser 0.66; Hemoglobin 13.6; Platelets 231; Potassium 3.7; Sodium 137    Lipid  Panel  No results found for: CHOL, TRIG, HDL, CHOLHDL, VLDL, LDLCALC, LDLDIRECT   Wt Readings from Last 3 Encounters:  07/17/18 119 lb (54 kg)  06/22/18 118 lb (53.5 kg)  12/09/17 119 lb 3.2 oz (54.1 kg)      Other studies Reviewed: Additional studies/ records that were reviewed today include: Telemetry monitor 12/18 - personally reviewed Review of the above records today demonstrates:   Normal sinus rhythm and sinus tachycardia with heart rate range from 60 to 120bpm.  Occasional PVC      ASSESSMENT AND PLAN:  1.  Inappropriate sinus tachycardia: She is symptomatic with palpitations, fatigue, and occasional dizziness.  She does wear an apple watch that is told her that her heart rate goes into the 130s to 140s at times.  Currently she feels well without major complaint.  I have told her to increase her hydration with, drinking 2 to 3 L of water a day.  I have also asked her to not look at her apple watch for a month to see if this changes her symptoms and makes her feel better.  She does say that when she is not paying attention, she does not notice her palpitations.  I do think that there is a anxiety component of this, with worrying about her heart causing her issues.    Current medicines are reviewed at length with the patient today.   The patient does not have concerns regarding her medicines.  The following changes were made today:  none  Labs/ tests ordered today include:  No orders of the defined types were placed in this encounter.    Disposition:   FU with Cidney Kirkwood 3 months  Signed, Temple Ewart Meredith Leeds, MD  07/17/2018 3:16 PM     [address] [place] [address] [phone] (office) [phone] (fax)

## 2018-07-22 DIAGNOSIS — R5381 Other malaise: Secondary | ICD-10-CM | POA: Diagnosis not present

## 2018-07-22 DIAGNOSIS — R0789 Other chest pain: Secondary | ICD-10-CM | POA: Diagnosis not present

## 2018-07-22 DIAGNOSIS — K5900 Constipation, unspecified: Secondary | ICD-10-CM | POA: Diagnosis not present

## 2018-07-28 DIAGNOSIS — L814 Other melanin hyperpigmentation: Secondary | ICD-10-CM | POA: Diagnosis not present

## 2018-07-28 DIAGNOSIS — D1801 Hemangioma of skin and subcutaneous tissue: Secondary | ICD-10-CM | POA: Diagnosis not present

## 2018-07-28 DIAGNOSIS — D2262 Melanocytic nevi of left upper limb, including shoulder: Secondary | ICD-10-CM | POA: Diagnosis not present

## 2018-08-10 DIAGNOSIS — Z01419 Encounter for gynecological examination (general) (routine) without abnormal findings: Secondary | ICD-10-CM | POA: Diagnosis not present

## 2018-08-10 DIAGNOSIS — Z124 Encounter for screening for malignant neoplasm of cervix: Secondary | ICD-10-CM | POA: Diagnosis not present

## 2018-08-10 DIAGNOSIS — Z681 Body mass index (BMI) 19 or less, adult: Secondary | ICD-10-CM | POA: Diagnosis not present

## 2018-08-10 LAB — HM PAP SMEAR: HM Pap smear: NORMAL

## 2018-08-17 ENCOUNTER — Telehealth: Payer: Self-pay | Admitting: Cardiology

## 2018-08-17 DIAGNOSIS — R000 Tachycardia, unspecified: Secondary | ICD-10-CM

## 2018-08-17 DIAGNOSIS — R002 Palpitations: Secondary | ICD-10-CM

## 2018-08-17 NOTE — Telephone Encounter (Signed)
Pt advised that we will order the TTE and 48 hour Holter Monitor per Dr.Camnitz.

## 2018-08-17 NOTE — Telephone Encounter (Signed)
Please order TTE and 48 hour holter.

## 2018-08-17 NOTE — Telephone Encounter (Signed)
° °  Patient calling with concerns of POTS.  Patient requesting order for echo, and holter monitor

## 2018-08-17 NOTE — Telephone Encounter (Signed)
Pt called to request that she has a Holter monitor and echo done... she says that she feels bad all day long with palpitations, fatigue and just cannot do her normal ADL's such as care for her disabled child.. She has been reading about her symptoms and says that she thinks she may have POTS but would feel better having these tests done if can be ordered by Dr. Curt Bears.. Advised her that I will forward her request to him and will call her back with his recommendations.

## 2018-08-22 ENCOUNTER — Other Ambulatory Visit: Payer: Self-pay

## 2018-08-22 ENCOUNTER — Ambulatory Visit (HOSPITAL_COMMUNITY): Payer: 59 | Attending: Cardiovascular Disease

## 2018-08-22 DIAGNOSIS — R000 Tachycardia, unspecified: Secondary | ICD-10-CM | POA: Diagnosis not present

## 2018-08-22 DIAGNOSIS — R002 Palpitations: Secondary | ICD-10-CM | POA: Insufficient documentation

## 2018-08-24 ENCOUNTER — Encounter (HOSPITAL_COMMUNITY): Payer: Self-pay | Admitting: Emergency Medicine

## 2018-08-24 ENCOUNTER — Other Ambulatory Visit: Payer: Self-pay

## 2018-08-24 ENCOUNTER — Emergency Department (HOSPITAL_COMMUNITY): Payer: 59

## 2018-08-24 ENCOUNTER — Emergency Department (HOSPITAL_COMMUNITY)
Admission: EM | Admit: 2018-08-24 | Discharge: 2018-08-25 | Disposition: A | Discharge status: Home / Self Care | Payer: 59 | Attending: Emergency Medicine | Admitting: Emergency Medicine

## 2018-08-24 ENCOUNTER — Telehealth: Payer: Self-pay | Admitting: Cardiology

## 2018-08-24 DIAGNOSIS — R002 Palpitations: Secondary | ICD-10-CM | POA: Diagnosis not present

## 2018-08-24 DIAGNOSIS — R0602 Shortness of breath: Secondary | ICD-10-CM | POA: Diagnosis not present

## 2018-08-24 DIAGNOSIS — R7989 Other specified abnormal findings of blood chemistry: Secondary | ICD-10-CM | POA: Diagnosis not present

## 2018-08-24 DIAGNOSIS — O9989 Other specified diseases and conditions complicating pregnancy, childbirth and the puerperium: Secondary | ICD-10-CM | POA: Diagnosis not present

## 2018-08-24 DIAGNOSIS — Z3A00 Weeks of gestation of pregnancy not specified: Secondary | ICD-10-CM | POA: Diagnosis not present

## 2018-08-24 LAB — CBC
HEMATOCRIT: 41.3 % (ref 36.0–46.0)
HEMOGLOBIN: 13.6 g/dL (ref 12.0–15.0)
MCH: 29.4 pg (ref 26.0–34.0)
MCHC: 32.9 g/dL (ref 30.0–36.0)
MCV: 89.4 fL (ref 80.0–100.0)
NRBC: 0 % (ref 0.0–0.2)
Platelets: 275 10*3/uL (ref 150–400)
RBC: 4.62 MIL/uL (ref 3.87–5.11)
RDW: 11.9 % (ref 11.5–15.5)
WBC: 6.9 10*3/uL (ref 4.0–10.5)

## 2018-08-24 LAB — BASIC METABOLIC PANEL
ANION GAP: 8 (ref 5–15)
BUN: 12 mg/dL (ref 6–20)
CHLORIDE: 105 mmol/L (ref 98–111)
CO2: 26 mmol/L (ref 22–32)
Calcium: 9.7 mg/dL (ref 8.9–10.3)
Creatinine, Ser: 0.69 mg/dL (ref 0.44–1.00)
GFR calc non Af Amer: >60 mL/min (ref >60)
Glucose, Bld: 101 mg/dL — ABNORMAL HIGH (ref 70–99)
POTASSIUM: 3.9 mmol/L (ref 3.5–5.1)
Sodium: 139 mmol/L (ref 135–145)

## 2018-08-24 LAB — PREGNANCY, URINE: PREG TEST UR: POSITIVE — AB

## 2018-08-24 LAB — I-STAT TROPONIN, ED: Troponin i, poc: 0 ng/mL (ref 0.00–0.08)

## 2018-08-24 LAB — D-DIMER, QUANTITATIVE (NOT AT ARMC): D DIMER QUANT: 0.58 ug{FEU}/mL — AB (ref 0.00–0.50)

## 2018-08-24 LAB — I-STAT BETA HCG BLOOD, ED (MC, WL, AP ONLY): HCG, QUANTITATIVE: 127.5 m[IU]/mL — AB (ref <5)

## 2018-08-24 LAB — HCG, QUANTITATIVE, PREGNANCY: hCG, Beta Chain, Quant, S: 123 m[IU]/mL — ABNORMAL HIGH (ref <5)

## 2018-08-24 NOTE — ED Notes (Signed)
Patient here for fast Heart Rate which she was referred by her cardiologist to come to the ER. Also reports lt arm numbness which started last night.

## 2018-08-24 NOTE — Telephone Encounter (Signed)
New message   Pt c/o of Chest Pain: STAT if CP now or developed within 24 hours  1. Are you having CP right now? Yes   2. Are you experiencing any other symptoms (ex. SOB, nausea, vomiting, sweating)? Sob, pressure in neck, numbness in left arm   3. How long have you been experiencing CP? Since 08/23/2018  4. Is your CP continuous or coming and going?coming and going   5. Have you taken Nitroglycerin?no

## 2018-08-24 NOTE — ED Notes (Signed)
Main lab to add on hcg quant 

## 2018-08-24 NOTE — ED Provider Notes (Signed)
Patient placed in Quick Look pathway, seen and evaluated   Chief Complaint: irregular heart beat, shortness of breath  HPI: Rose [name] is a 37 y.o. female who present to the ED with chest pain and shortness of breath. Patient reports that last night she had pain in the left chest area that radiated to her left jaw and down her left arm. Patient has been   ROS: CV: chest pain  Resp: shortness of breath  Physical Exam:  BP 112/72   Pulse 98   Temp 98.2 F (36.8 C) (Oral)   Resp 17   Ht 5\' 7"  (1.702 m)   Wt 53.1 kg   SpO2 100%   BMI 18.32 kg/m    Gen: No distress  Neuro: Awake and Alert  Skin: Warm and dry  Lungs: clear  Heart:normal rate     Initiation of care has begun. The patient has been counseled on the process, plan, and necessity for staying for the completion/evaluation, and the remainder of the medical screening examination    Ashley Murrain, NP 08/24/18 [place], Wenda Overland, MD 08/24/18 [phone]

## 2018-08-24 NOTE — Telephone Encounter (Signed)
Pt called to report that she has been having chest pain and left arm numbness since last night.. She says it is worsening and radiating to her neck.Marland Kitchen  and she in now SOB and feels her heart racing. She says she doesn't feel well and is very upset on the phone... I tried to help her relax and advised her that with her symptoms and her report of feeling like she may pass out from her symptoms.. I advised her to call EMS and she agrees to go to the ER and says she will have her husband take her.

## 2018-08-24 NOTE — ED Triage Notes (Signed)
Pt presents with c/o irregular HR and shortness of breath. Pt reports seeing cardiologist and electrophysiologist for irregular fast heart rate but no answers.  She now reports left arm numbness. Denies n/v/f. The patient is alert and oriented x4 with no acute distress at triage.

## 2018-08-24 NOTE — ED Provider Notes (Signed)
[facility] EMERGENCY DEPARTMENT Provider Note   CSN: [id number] Arrival date & time: 08/24/18  1604     History   Chief Complaint Chief Complaint  Patient presents with  . Irregular Heart Beat  . Shortness of Breath    HPI Rose [name] is a 37 y.o. female.  37 year old female with a history of palpitations presents to the emergency department for evaluation of persistent palpitations.  She has been experiencing symptoms since August.  Will feel like her heart is racing and notes that her heart rate will get up into the 170s.  She developed similar palpitations yesterday with associated chest tightness.  She became concerned when she noticed some subjective numbness in her left upper extremity.  Patient also reports associated dyspnea on exertion, though this has been persistent over the past few months and associated with increased fatigue.  She has been followed by cardiology for symptoms.  Had normal echocardiogram 2 days ago.  Is scheduled for a Holter monitor in 5 days.  She denies any recent fevers or viral illness.  She has had no leg swelling, vomiting.  Does note situational stress; cares for her disabled child.  Denies use of supplements, stimulants, caffeine.  The history is provided by the patient. No language interpreter was used.  Shortness of Breath     Past Medical History:  Diagnosis Date  . Palpitations     Patient Active Problem List   Diagnosis Date Noted  . Family history of Rett syndrome 12/26/2017  . Right groin pain - [redacted] weeks pregnant; significant venous congestion around groin 11/21/2012  . Subchorionic hemorrhage 07/17/2012  . Vaginal bleeding in pregnancy 07/17/2012  . BACK PAIN, CHRONIC 01/12/2010  . PALPITATIONS 01/12/2010    Past Surgical History:  Procedure Laterality Date  . BACK SURGERY     x 2  . BREAST SURGERY    . CESAREAN SECTION N/A 01/29/2013   Procedure: Primary cesarean section with delivery of baby ;   Surgeon: Logan Bores, MD;  Location: [facility];  Service: Obstetrics;  Laterality: N/A;  Primary  . TONSILLECTOMY       OB History    Gravida  4   Para  2   Term  2   Preterm      AB  2   Living  2     SAB  2   TAB      Ectopic      Multiple      Live Births  2            Home Medications    Prior to Admission medications   Medication Sig Start Date End Date Taking? Authorizing Provider  Cholecalciferol (VITAMIN D3) 1000 UNIT/SPRAY LIQD Take 2,000 Units by mouth daily.   Yes [provider]  Ferrous Fumarate (IRON) 18 MG TBCR Take 18 mg by mouth daily. Take 1 tablet by mouth daily   Yes [provider]  lactobacillus acidophilus (BACID) TABS tablet Take 2 tablets by mouth 3 (three) times daily.   Yes [provider]    Family History Family History  Problem Relation Age of Onset  . Heart disease Father   . Other Daughter        Rett syndrome     Social History Social History   Tobacco Use  . Smoking status: Never Smoker  . Smokeless tobacco: Never Used  Substance Use Topics  . Alcohol use: No  . Drug use: No  Allergies   Tape and Codeine   Review of Systems Review of Systems  Respiratory: Positive for shortness of breath.   Ten systems reviewed and are negative for acute change, except as noted in the HPI.    Physical Exam Updated Vital Signs BP (!) 106/55   Pulse 94   Temp 98.9 F (37.2 C) (Oral)   Resp 17   Ht 5\' 7"  (1.702 m)   Wt 53.1 kg   SpO2 100%   BMI 18.32 kg/m   Physical Exam  Constitutional: She is oriented to person, place, and time. She appears well-developed and well-nourished. No distress.  Nontoxic appearing, intermittently anxious  HENT:  Head: Normocephalic and atraumatic.  Eyes: Conjunctivae and EOM are normal. No scleral icterus.  Neck: Normal range of motion.  Cardiovascular: Regular rhythm and intact distal pulses.  Intermittent tachycardia.  Pulmonary/Chest: Effort  normal. No stridor. No respiratory distress.  Respirations even and unlabored  Musculoskeletal: Normal range of motion.  Neurological: She is alert and oriented to person, place, and time. She exhibits normal muscle tone. Coordination normal.  Ambulatory with steady gait.  Skin: Skin is warm and dry. No rash noted. She is not diaphoretic. No erythema. No pallor.  Psychiatric: She has a normal mood and affect. Her behavior is normal.  Nursing note and vitals reviewed.    ED Treatments / Results  Labs (all labs ordered are listed, but only abnormal results are displayed) Labs Reviewed  BASIC METABOLIC PANEL - Abnormal; Notable for the following components:      Result Value   Glucose, Bld 101 (*)    All other components within normal limits  HCG, QUANTITATIVE, PREGNANCY - Abnormal; Notable for the following components:   hCG, Beta Chain, Quant, S 123 (*)    All other components within normal limits  I-STAT BETA HCG BLOOD, ED ([facility], [facility], [facility] ONLY) - Abnormal; Notable for the following components:   I-stat hCG, quantitative 127.5 (*)    All other components within normal limits  CBC  D-DIMER, QUANTITATIVE (NOT AT [facility])  PREGNANCY, URINE  I-STAT TROPONIN, ED    EKG EKG Interpretation  Date/Time:  Thursday August 24 2018 16:07:55 EDT Ventricular Rate:  109 PR Interval:  168 QRS Duration: 74 QT Interval:  350 QTC Calculation: 471 R Axis:   90 Text Interpretation:  Sinus tachycardia Biatrial enlargement Rightward axis Pulmonary disease pattern Abnormal ECG No significant change was found Confirmed by Jola Schmidt [id number]) on 08/25/2018 10:34:12 AM   Radiology Dg Chest 2 View  Result Date: 08/24/2018 CLINICAL DATA:  Irregular heart rate and shortness of breath EXAM: CHEST - 2 VIEW COMPARISON:  None. FINDINGS: The heart size and mediastinal contours are within normal limits. Both lungs are clear. The visualized skeletal structures are unremarkable. IMPRESSION: No active  cardiopulmonary disease. Electronically Signed   By: Dorise Bullion III M.D   On: 08/24/2018 18:12   Ct Angio Chest Pe W And/or Wo Contrast  Result Date: 08/25/2018 CLINICAL DATA:  PE suspected, intermediate prob, positive D-dimer. Pregnant patient in early first trimester pregnancy, shielded. EXAM: CT ANGIOGRAPHY CHEST WITH CONTRAST TECHNIQUE: Multidetector CT imaging of the chest was performed using the standard protocol during bolus administration of intravenous contrast. Multiplanar CT image reconstructions and MIPs were obtained to evaluate the vascular anatomy. CONTRAST:  55 mL ISOVUE-370 IOPAMIDOL (ISOVUE-370) INJECTION 76% COMPARISON:  Chest radiograph yesterday. FINDINGS: Cardiovascular: There are no filling defects within the pulmonary arteries to suggest pulmonary embolus. The thoracic aorta is normal  in caliber without dissection. Common origin of the brachiocephalic left common carotid artery, normal variant. Heart is normal in size. No pericardial effusion. Mediastinum/Nodes: No enlarged mediastinal or hilar lymph nodes. No thyroid nodule. Esophagus is decompressed. Lungs/Pleura: Minimal biapical pleuroparenchymal scarring. Incidental azygos fissure. No consolidation, pulmonary edema, or pleural fluid. Upper Abdomen: Unremarkable where visualized. Musculoskeletal: There are no acute or suspicious osseous abnormalities. Review of the MIP images confirms the above findings. IMPRESSION: No pulmonary embolus or acute intrathoracic abnormality. Electronically Signed   By: Keith Rake M.D.   On: 08/25/2018 02:05     Procedures Procedures (including critical care time)  Medications Ordered in ED Medications - No data to display   Initial Impression / Assessment and Plan / ED Course  I have reviewed the triage vital signs and the nursing notes.  Pertinent labs & imaging results that were available during my care of the patient were reviewed by me and considered in my medical decision  making (see chart for details).     37 year old female presents to the emergency department for complaints of palpitations.  She has been seen by cardiology as an outpatient since August for similar complaints.  The patient is noted to have intermittent tachycardia ranging into the 120s.  She has not had any hypoxia, hypotension, fever.  Cardiac work-up has been reassuring with negative troponin.  She does not have any risk factors for ACS and her symptoms seem atypical for this.  A d-dimer was drawn which resulted positive.  The patient was incidentally found to be pregnant.  Discussion was had between myself and the patient regarding proceeding with CT scan in the setting of very early pregnancy.  Discussed my inability to guarantee no radiation exposure to developing fetus.  Patient verbalizes understanding.  I explained these risks, also, to patient's husband over the phone.  Patient and husband expressed comfort proceeding with CT angiogram for rule out of pulmonary embolus.  CT scan today does not show any acute abnormality.  Given chronicity of symptoms, I do not believe further emergent work-up is indicated.  The patient is scheduled to begin use of a Holter monitor on 08/29/2018.  I believe it is reasonable for her to continue cardiac follow-up as an outpatient as scheduled.  Advised against the use of caffeine or other stimulants.  Question whether patient's symptoms may be stress related as she cares for her daughter with special needs.  Return precautions discussed and provided. Patient discharged in stable condition with no unaddressed concerns.   Final Clinical Impressions(s) / ED Diagnoses   Final diagnoses:  Palpitations    ED Discharge Orders    None       Antonietta Breach, PA-C 09/01/18 3570    Ezequiel Essex, MD 09/01/18 [phone]

## 2018-08-25 ENCOUNTER — Emergency Department (HOSPITAL_COMMUNITY): Payer: 59

## 2018-08-25 DIAGNOSIS — R7989 Other specified abnormal findings of blood chemistry: Secondary | ICD-10-CM | POA: Diagnosis not present

## 2018-08-25 MED ORDER — IOPAMIDOL (ISOVUE-370) INJECTION 76%
100.0000 mL | Freq: Once | INTRAVENOUS | Status: AC | PRN
  Administered 2018-08-25: 100 mL via INTRAVENOUS

## 2018-08-25 NOTE — ED Notes (Signed)
Reviewed d/c instructions with pt, who verbalized understanding and had no outstanding questions. Pt departed in NAD, refused use of wheelchair.   

## 2018-08-25 NOTE — Discharge Instructions (Signed)
Your work-up in the emergency department was reassuring.  Your CT did not show evidence of a blood clot.  We recommend that you continue follow-up with your cardiologist as scheduled as well as completion of your Holter monitor.  You were incidentally found to be pregnant.  Blood tests suggest that you are between 2 and [redacted] weeks gestation.  You should have your hCG level rechecked by your OB/GYN or primary care doctor in 48 hours to see if this level is increasing.  Initiate use of a prenatal vitamin.  Avoid anti-inflammatory medications such as ibuprofen or Aleve.  You may return to the ED for new or concerning symptoms.

## 2018-08-25 NOTE — ED Notes (Signed)
Patient transported to CT 

## 2018-08-28 DIAGNOSIS — Z3201 Encounter for pregnancy test, result positive: Secondary | ICD-10-CM | POA: Diagnosis not present

## 2018-08-29 ENCOUNTER — Ambulatory Visit (INDEPENDENT_AMBULATORY_CARE_PROVIDER_SITE_OTHER): Payer: 59

## 2018-08-29 DIAGNOSIS — R000 Tachycardia, unspecified: Secondary | ICD-10-CM

## 2018-08-29 DIAGNOSIS — R002 Palpitations: Secondary | ICD-10-CM | POA: Diagnosis not present

## 2018-08-31 ENCOUNTER — Telehealth: Payer: Self-pay | Admitting: *Deleted

## 2018-08-31 NOTE — Telephone Encounter (Signed)
Left voice message.  Holter monitor applied 08/30/18 and due to be removed 08/31/18 8:30AM.  We are calling all patients with monitors to ask they return their monitor asap, preferable this AM or afternoon d/t a inventory shortage.  If you are unable to return your monitor today, please call [phone].  Thank you for your consideration.

## 2018-09-20 ENCOUNTER — Telehealth: Payer: Self-pay | Admitting: Cardiology

## 2018-09-20 NOTE — Telephone Encounter (Signed)
Pt is taking Voxylamine started for nausea/issues w/ pregnancy. She is also experiencing some dizziness. Neither of those issues did she experience w/ her other pregnancies.  Advised pt to take her BP during dizzy spell to ensure it is not a BP related dizziness.  Pt agreeable. Sitting HR today 110. She would like to know if we have other recommendations that may not give her palpitations. She understands I will discuss w/ pharmacist/physician tomorrow and let her know. (she is also going to discuss w/ her OB this Friday)

## 2018-09-20 NOTE — Telephone Encounter (Signed)
New Message   Pt c/o medication issue:  1. Name of Medication: Voxylamine  2. How are you currently taking this medication (dosage and times per day)?   3. Are you having a reaction (difficulty breathing--STAT)?   4. What is your medication issue? Pt states this medication is giving her palpitations and she is pregnant. Please call

## 2018-09-21 NOTE — Telephone Encounter (Signed)
Pt reports that she is not going to take the Doxylamine again.  Its not worth feeling the way she did on it. Advised that she may try Zofran or Vit B for nausea.  Explained there is a risk for cleft palate w/ Zofran.  Advised her to discuss options w/ her OB on Friday.  Pt stated that she will not try the Zofran d/t risk, states she already has a special needs child at home and won't risk.   Pt appreciates our help/advisement. She will follow up end of year with [facility].

## 2018-09-22 DIAGNOSIS — O0991 Supervision of high risk pregnancy, unspecified, first trimester: Secondary | ICD-10-CM | POA: Diagnosis not present

## 2018-09-22 DIAGNOSIS — Z3A01 Less than 8 weeks gestation of pregnancy: Secondary | ICD-10-CM | POA: Diagnosis not present

## 2018-09-30 ENCOUNTER — Inpatient Hospital Stay (HOSPITAL_COMMUNITY)
Admission: AD | Admit: 2018-09-30 | Discharge: 2018-09-30 | Disposition: A | Discharge status: Home / Self Care | Payer: 59 | Source: Ambulatory Visit | Attending: Obstetrics and Gynecology | Admitting: Obstetrics and Gynecology

## 2018-09-30 ENCOUNTER — Encounter (HOSPITAL_COMMUNITY): Payer: Self-pay

## 2018-09-30 DIAGNOSIS — Z3A08 8 weeks gestation of pregnancy: Secondary | ICD-10-CM

## 2018-09-30 DIAGNOSIS — O209 Hemorrhage in early pregnancy, unspecified: Secondary | ICD-10-CM | POA: Insufficient documentation

## 2018-09-30 LAB — URINALYSIS, ROUTINE W REFLEX MICROSCOPIC
Bilirubin Urine: NEGATIVE
Glucose, UA: NEGATIVE mg/dL
HGB URINE DIPSTICK: NEGATIVE
Ketones, ur: NEGATIVE mg/dL
LEUKOCYTES UA: NEGATIVE
Nitrite: NEGATIVE
PH: 7 (ref 5.0–8.0)
PROTEIN: NEGATIVE mg/dL
Specific Gravity, Urine: 1.001 — ABNORMAL LOW (ref 1.005–1.030)

## 2018-09-30 LAB — POCT PREGNANCY, URINE: Preg Test, Ur: POSITIVE — AB

## 2018-09-30 LAB — HCG, QUANTITATIVE, PREGNANCY: hCG, Beta Chain, Quant, S: 177050 m[IU]/mL — ABNORMAL HIGH (ref <5)

## 2018-09-30 NOTE — MAU Note (Signed)
Started cramping yesterday afternoon  Started bleeding this morning   Has gotten some care at [facility] due to oldest child having Rett Syndrome

## 2018-09-30 NOTE — MAU Provider Note (Signed)
History     CSN: [id number]  Arrival date and time: 09/30/18 1457   First Provider Initiated Contact with Patient 09/30/18 1605      Chief Complaint  Patient presents with  . Abdominal Pain  . 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 Marvel Plan In addition to bleeding also has cramping Had a hcg blood test earlier in pregnancy that showed very early pregnancy No repeat or heart tones since then   Past Medical History:  Diagnosis Date  . Palpitations     Past Surgical History:  Procedure Laterality Date  . BACK SURGERY     x 2  . BREAST SURGERY    . CESAREAN SECTION N/A 01/29/2013   Procedure: Primary cesarean section with delivery of baby ;  Surgeon: Logan Bores, MD;  Location: [facility];  Service: Obstetrics;  Laterality: N/A;  Primary  . TONSILLECTOMY      Family History  Problem Relation Age of Onset  . Heart disease Father   . Other Daughter        Rett syndrome     Social History   Tobacco Use  . Smoking status: Never Smoker  . Smokeless tobacco: Never Used  Substance Use Topics  . Alcohol use: No  . Drug use: No    Allergies:  Allergies  Allergen Reactions  . Tape Dermatitis    Severe skin burn from adhesive tape  . Codeine Nausea And Vomiting    Pt does not recall if she has taken percocet or vicodin.  Discharge summary from last pregnancy says she was given prescription for ibuprofen and percocet.    Medications Prior to Admission  Medication Sig Dispense Refill Last Dose  . Cholecalciferol (VITAMIN D3) 1000 UNIT/SPRAY LIQD Take 2,000 Units by mouth daily.   08/24/2018 at Unknown time  . Ferrous Fumarate (IRON) 18 MG TBCR Take 18 mg by mouth daily. Take 1 tablet by mouth daily   08/24/2018 at  Unknown time  . lactobacillus acidophilus (BACID) TABS tablet Take 2 tablets by mouth 3 (three) times daily.   08/24/2018 at Unknown time    Review of Systems  All other systems reviewed and are negative.  Physical Exam   Blood pressure (!) 97/46, pulse 88, temperature 97.6 F (36.4 C), temperature source Oral, resp. rate 18, weight 53 kg, last menstrual period 07/31/2018, unknown if currently breastfeeding.  Physical Exam  Constitutional: She is oriented to person, place, and time. She appears well-developed and well-nourished. No distress.  HENT:  Head: Normocephalic and atraumatic.  Eyes: Right eye exhibits no discharge. Left eye exhibits no discharge. No scleral icterus.  Cardiovascular: Normal rate and regular rhythm.  Respiratory: Effort normal. No respiratory distress.  Neurological: She is alert and oriented to person, place, and time.  Skin: Skin is warm and dry. No rash noted. She is not diaphoretic.  Psychiatric: She has a normal mood and affect. Her behavior is normal. Judgment and thought content normal.    MAU Course  Procedures Bedside US Single IUP with cardiac activity HR 183  Assessment and Plan  Vaginal bleeding in pregnancy, first trimester - Plan: Discharge patient - Bedside US with IUP with cardiac activity  - repeat hcg drawn and pending at the time  of discharge (patient reports that she needs to leave and will follow up for results) - return precautions discussed - follow up at scheduled visit with Ob or PRN    Aura Camps 09/30/2018, 4:51 PM

## 2018-09-30 NOTE — Discharge Instructions (Signed)
Vaginal Bleeding During Pregnancy, First Trimester °A small amount of bleeding (spotting) from the vagina is common in early pregnancy. Sometimes the bleeding is normal and is not a problem, and sometimes it is a sign of something serious. Be sure to tell your doctor about any bleeding from your vagina right away. °Follow these instructions at home: °· Watch your condition for any changes. °· Follow your doctor's instructions about how active you can be. °· If you are on bed rest: °? You may need to stay in bed and only get up to use the bathroom. °? You may be allowed to do some activities. °? If you need help, make plans for someone to help you. °· Write down: °? The number of pads you use each day. °? How often you change pads. °? How soaked (saturated) your pads are. °· Do not use tampons. °· Do not douche. °· Do not have sex or orgasms until your doctor says it is okay. °· If you pass any tissue from your vagina, save the tissue so you can show it to your doctor. °· Only take medicines as told by your doctor. °· Do not take aspirin because it can make you bleed. °· Keep all follow-up visits as told by your doctor. °Contact a doctor if: °· You bleed from your vagina. °· You have cramps. °· You have labor pains. °· You have a fever that does not go away after you take medicine. °Get help right away if: °· You have very bad cramps in your back or belly (abdomen). °· You pass large clots or tissue from your vagina. °· You bleed more. °· You feel light-headed or weak. °· You pass out (faint). °· You have chills. °· You are leaking fluid or have a gush of fluid from your vagina. °· You pass out while pooping (having a bowel movement). °This information is not intended to replace advice given to you by your health care provider. Make sure you discuss any questions you have with your health care provider. °Document Released: 03/04/2014 Document Revised: 03/25/2016 Document Reviewed: 06/25/2013 °Elsevier Interactive  Patient Education © 2018 [employer] ° °

## 2018-10-02 DIAGNOSIS — O0991 Supervision of high risk pregnancy, unspecified, first trimester: Secondary | ICD-10-CM | POA: Diagnosis not present

## 2018-10-02 DIAGNOSIS — O09521 Supervision of elderly multigravida, first trimester: Secondary | ICD-10-CM | POA: Diagnosis not present

## 2018-10-02 DIAGNOSIS — Z8489 Family history of other specified conditions: Secondary | ICD-10-CM | POA: Diagnosis not present

## 2018-10-12 DIAGNOSIS — O09521 Supervision of elderly multigravida, first trimester: Secondary | ICD-10-CM | POA: Diagnosis not present

## 2018-10-16 ENCOUNTER — Encounter: Payer: Self-pay | Admitting: *Deleted

## 2018-10-27 DIAGNOSIS — Z3A12 12 weeks gestation of pregnancy: Secondary | ICD-10-CM | POA: Diagnosis not present

## 2018-10-27 DIAGNOSIS — O09521 Supervision of elderly multigravida, first trimester: Secondary | ICD-10-CM | POA: Diagnosis not present

## 2018-10-27 DIAGNOSIS — O0991 Supervision of high risk pregnancy, unspecified, first trimester: Secondary | ICD-10-CM | POA: Diagnosis not present

## 2018-10-27 DIAGNOSIS — Z8489 Family history of other specified conditions: Secondary | ICD-10-CM | POA: Diagnosis not present

## 2018-10-31 ENCOUNTER — Ambulatory Visit: Payer: 59 | Admitting: Cardiology

## 2018-10-31 DIAGNOSIS — O021 Missed abortion: Secondary | ICD-10-CM | POA: Diagnosis not present

## 2018-10-31 DIAGNOSIS — O2621 Pregnancy care for patient with recurrent pregnancy loss, first trimester: Secondary | ICD-10-CM | POA: Diagnosis not present

## 2018-11-02 DIAGNOSIS — Z3A11 11 weeks gestation of pregnancy: Secondary | ICD-10-CM | POA: Diagnosis not present

## 2018-11-02 DIAGNOSIS — O039 Complete or unspecified spontaneous abortion without complication: Secondary | ICD-10-CM | POA: Diagnosis not present

## 2018-11-02 DIAGNOSIS — O021 Missed abortion: Secondary | ICD-10-CM | POA: Diagnosis not present

## 2018-11-02 DIAGNOSIS — Z885 Allergy status to narcotic agent status: Secondary | ICD-10-CM | POA: Diagnosis not present

## 2018-11-14 DIAGNOSIS — Z09 Encounter for follow-up examination after completed treatment for conditions other than malignant neoplasm: Secondary | ICD-10-CM | POA: Diagnosis not present

## 2019-07-30 ENCOUNTER — Encounter (HOSPITAL_COMMUNITY): Payer: Self-pay

## 2019-09-04 ENCOUNTER — Other Ambulatory Visit: Payer: Self-pay

## 2019-09-04 DIAGNOSIS — Z20822 Contact with and (suspected) exposure to covid-19: Secondary | ICD-10-CM

## 2019-09-05 LAB — NOVEL CORONAVIRUS, NAA: SARS-CoV-2, NAA: NOT DETECTED

## 2020-01-21 ENCOUNTER — Ambulatory Visit: Payer: 59 | Attending: Internal Medicine

## 2020-01-21 DIAGNOSIS — Z20822 Contact with and (suspected) exposure to covid-19: Secondary | ICD-10-CM

## 2020-01-22 ENCOUNTER — Other Ambulatory Visit: Payer: 59

## 2020-01-22 LAB — NOVEL CORONAVIRUS, NAA: SARS-CoV-2, NAA: NOT DETECTED

## 2020-01-22 LAB — SARS-COV-2, NAA 2 DAY TAT

## 2020-04-21 ENCOUNTER — Ambulatory Visit: Payer: 59 | Attending: Internal Medicine

## 2020-04-21 DIAGNOSIS — Z20822 Contact with and (suspected) exposure to covid-19: Secondary | ICD-10-CM

## 2020-04-22 LAB — SARS-COV-2, NAA 2 DAY TAT

## 2020-04-22 LAB — NOVEL CORONAVIRUS, NAA: SARS-CoV-2, NAA: NOT DETECTED

## 2020-08-11 IMAGING — CT CT ANGIO CHEST
2 of 8 series · 19 of 46 positions shown · IV contrast (iopamidol)
Comparison: Chest radiograph yesterday.

CLINICAL DATA: PE suspected, intermediate prob, positive D-dimer.
Pregnant patient in early first trimester pregnancy, shielded.

EXAM:
CT ANGIOGRAPHY CHEST WITH CONTRAST
TECHNIQUE: Multidetector CT imaging of the chest was performed using the
standard protocol during bolus administration of intravenous
contrast. Multiplanar CT image reconstructions and MIPs were
obtained to evaluate the vascular anatomy.
CONTRAST:  55 mL IYEEMF-NFQ IOPAMIDOL (IYEEMF-NFQ) INJECTION 76%

[Series 6: thins · axial · 0.75mm/px · z∈[+1027,+1248]mm · 16 of 245 slices shown]
[im 12/245  lung]
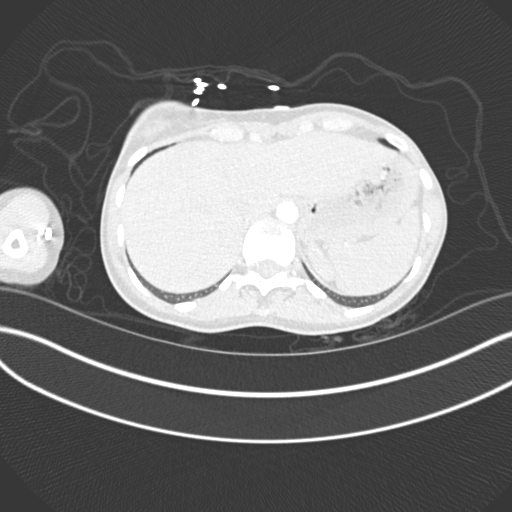
[im 23/245  soft-tissue]
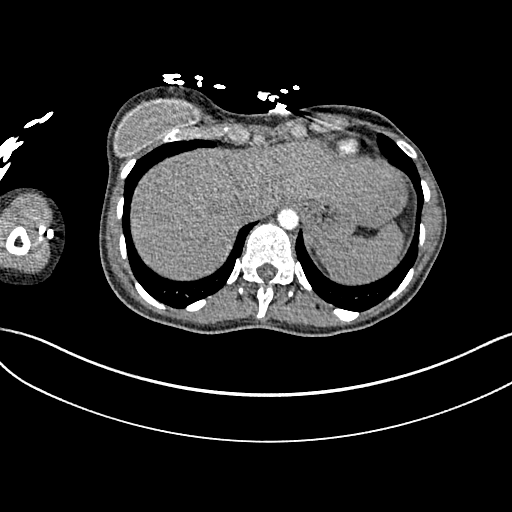
[im 45/245  lung]
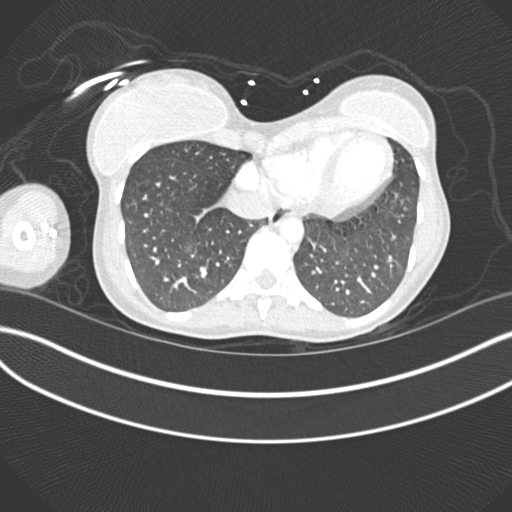
[im 56/245  soft-tissue]
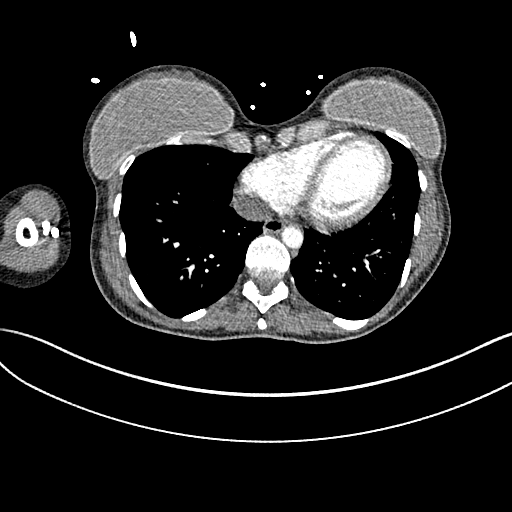
[im 67/245  lung]
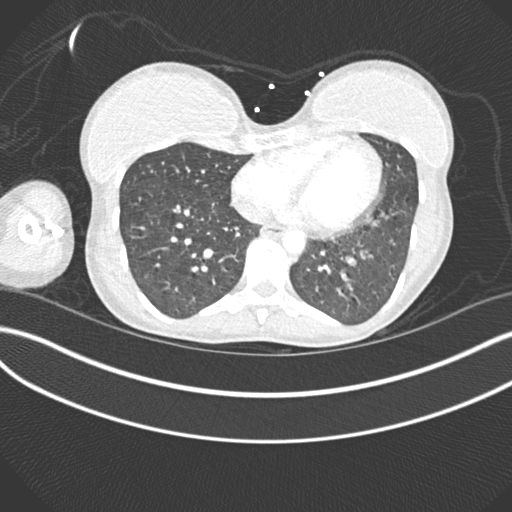
[im 89/245  soft-tissue]
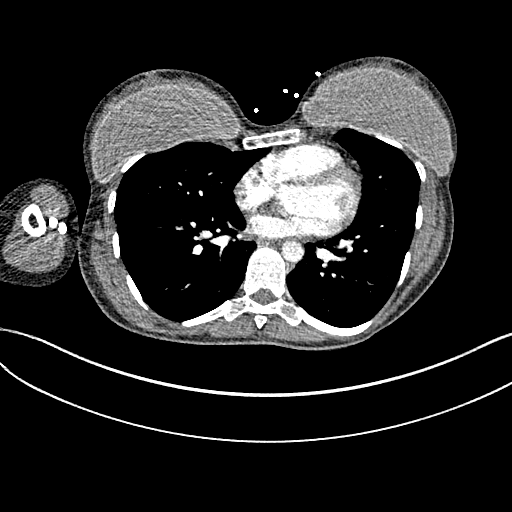
[im 100/245  lung]
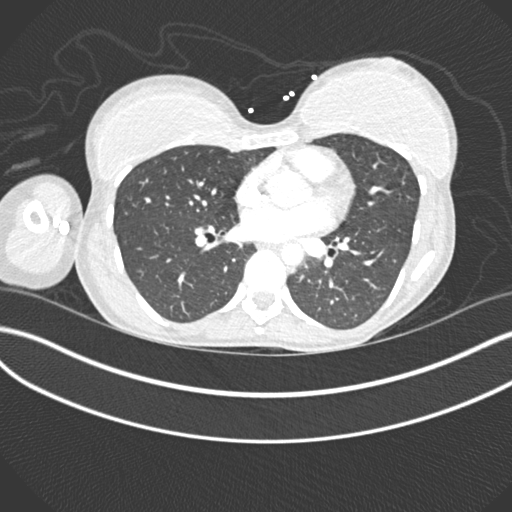
[im 111/245  soft-tissue]
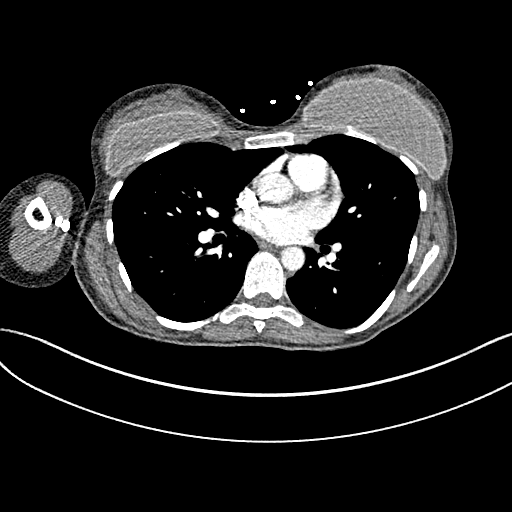
[im 134/245  lung]
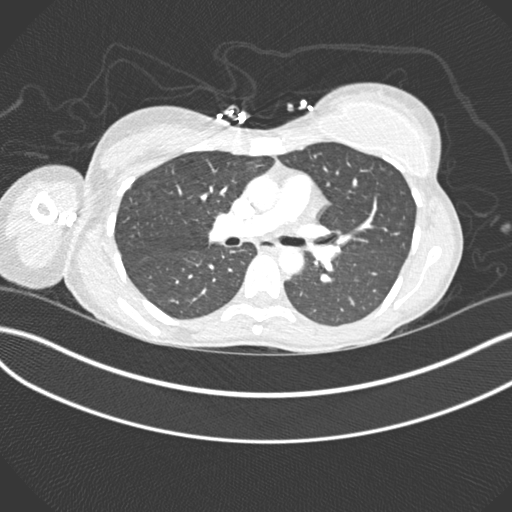
[im 145/245  soft-tissue]
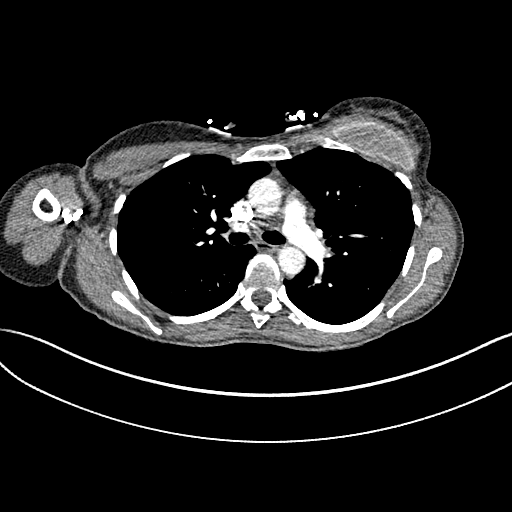
[im 156/245  lung]
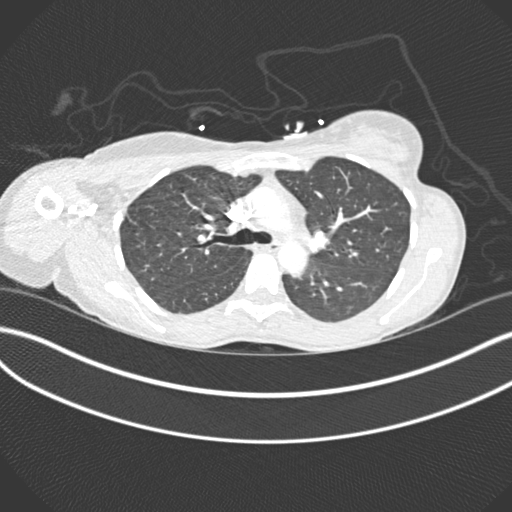
[im 178/245  soft-tissue]
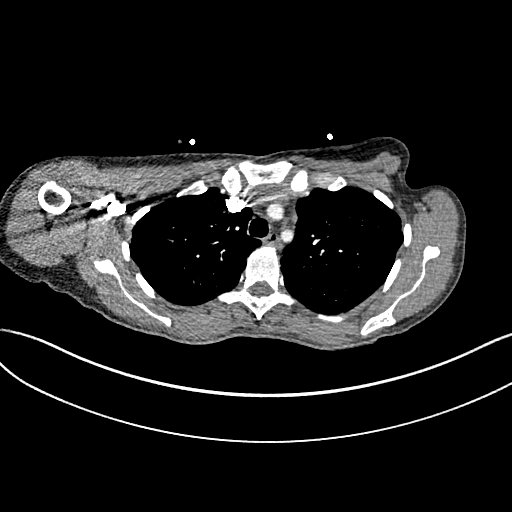
[im 189/245  lung]
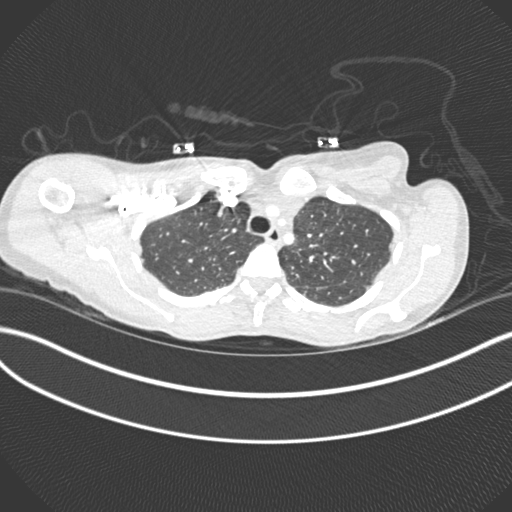
[im 200/245  soft-tissue]
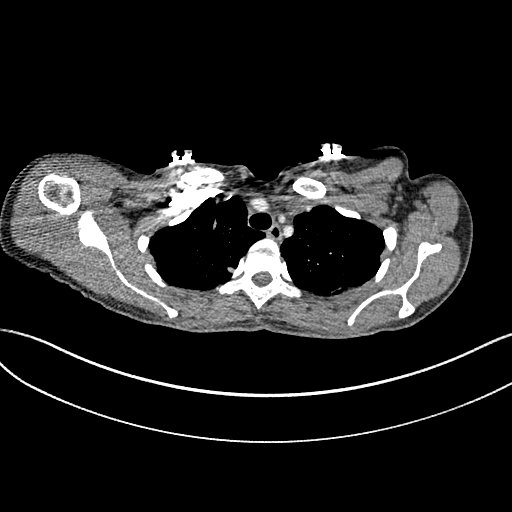
[im 222/245  lung]
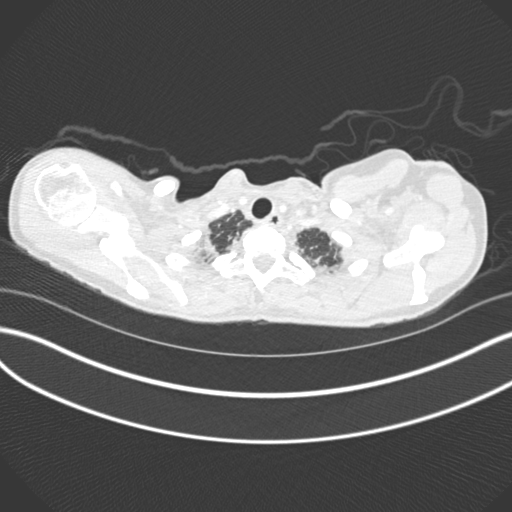
[im 233/245  soft-tissue]
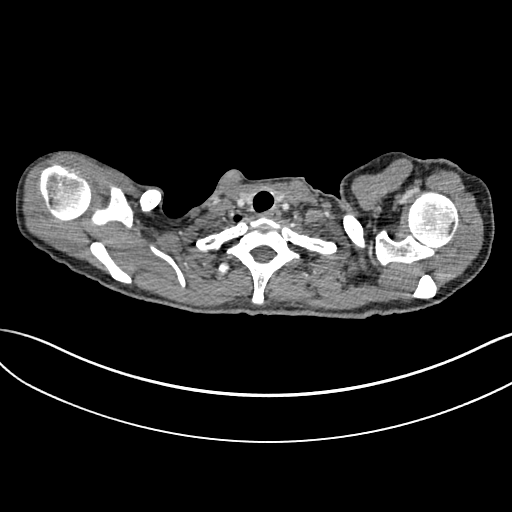

[Series 8: coronal mpr · coronal · 0.48mm/px · 3 of 151 slices shown]
[im 38/151  soft-tissue]
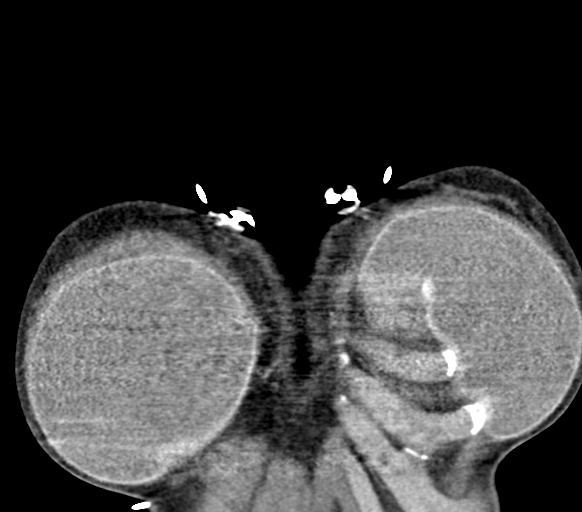
[im 76/151  soft-tissue]
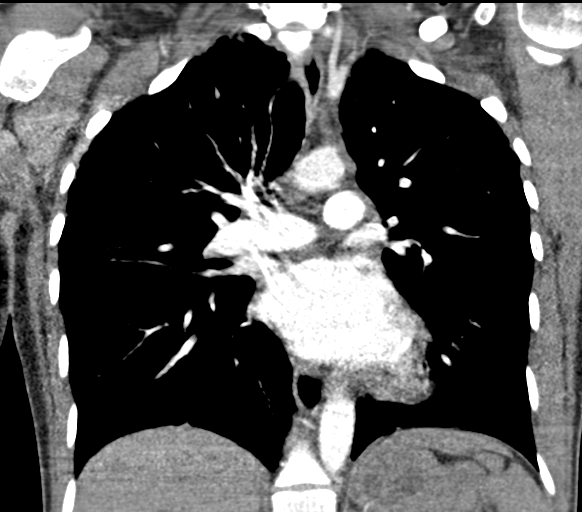
[im 113/151  soft-tissue]
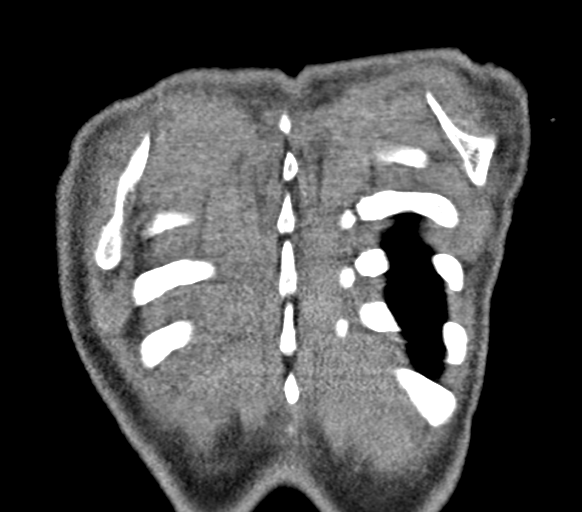

[19 of 46 positions shown; findings below may reference images not displayed]

FINDINGS: Cardiovascular: There are no filling defects within the pulmonary
arteries to suggest pulmonary embolus. The thoracic aorta is normal
in caliber without dissection. Common origin of the brachiocephalic
left common carotid artery, normal variant. Heart is normal in size.
No pericardial effusion.

Mediastinum/Nodes: No enlarged mediastinal or hilar lymph nodes. No
thyroid nodule. Esophagus is decompressed.

Lungs/Pleura: Minimal biapical pleuroparenchymal scarring.
Incidental azygos fissure. No consolidation, pulmonary edema, or
pleural fluid.

Upper Abdomen: Unremarkable where visualized.

Musculoskeletal: There are no acute or suspicious osseous
abnormalities.

Review of the MIP images confirms the above findings.
IMPRESSION: No pulmonary embolus or acute intrathoracic abnormality.

## 2020-08-29 ENCOUNTER — Telehealth: Payer: Self-pay

## 2020-08-29 NOTE — Telephone Encounter (Signed)
LVM to call back to schedule new patient appointment.

## 2020-12-30 ENCOUNTER — Other Ambulatory Visit: Payer: Self-pay

## 2020-12-31 ENCOUNTER — Encounter: Payer: Self-pay | Admitting: Family Medicine

## 2020-12-31 ENCOUNTER — Ambulatory Visit (INDEPENDENT_AMBULATORY_CARE_PROVIDER_SITE_OTHER): Payer: 59 | Admitting: Family Medicine

## 2020-12-31 VITALS — BP 110/68 | HR 82 | Temp 98.3°F | Ht 67.5 in | Wt 120.0 lb

## 2020-12-31 DIAGNOSIS — Z2821 Immunization not carried out because of patient refusal: Secondary | ICD-10-CM

## 2020-12-31 DIAGNOSIS — Z8349 Family history of other endocrine, nutritional and metabolic diseases: Secondary | ICD-10-CM | POA: Diagnosis not present

## 2020-12-31 DIAGNOSIS — Z0000 Encounter for general adult medical examination without abnormal findings: Secondary | ICD-10-CM | POA: Diagnosis not present

## 2020-12-31 DIAGNOSIS — Z13220 Encounter for screening for lipoid disorders: Secondary | ICD-10-CM | POA: Diagnosis not present

## 2020-12-31 DIAGNOSIS — Z1321 Encounter for screening for nutritional disorder: Secondary | ICD-10-CM | POA: Diagnosis not present

## 2020-12-31 LAB — BASIC METABOLIC PANEL
BUN: 13 mg/dL (ref 6–23)
CO2: 29 mEq/L (ref 19–32)
Calcium: 9.5 mg/dL (ref 8.4–10.5)
Chloride: 105 mEq/L (ref 96–112)
Creatinine, Ser: 0.66 mg/dL (ref 0.40–1.20)
GFR: 110.11 mL/min (ref >60.00)
Glucose, Bld: 83 mg/dL (ref 70–99)
Potassium: 4.4 mEq/L (ref 3.5–5.1)
Sodium: 139 mEq/L (ref 135–145)

## 2020-12-31 LAB — LIPID PANEL
Cholesterol: 158 mg/dL (ref 0–200)
HDL: 61.4 mg/dL
LDL Cholesterol: 85 mg/dL (ref 0–99)
NonHDL: 96.66
Total CHOL/HDL Ratio: 3
Triglycerides: 57 mg/dL (ref 0.0–149.0)
VLDL: 11.4 mg/dL (ref 0.0–40.0)

## 2020-12-31 LAB — CBC
HCT: 38.5 % (ref 36.0–46.0)
Hemoglobin: 13.1 g/dL (ref 12.0–15.0)
MCHC: 34.1 g/dL (ref 30.0–36.0)
MCV: 91.3 fl (ref 78.0–100.0)
Platelets: 228 10*3/uL (ref 150.0–400.0)
RBC: 4.22 Mil/uL (ref 3.87–5.11)
RDW: 13.2 % (ref 11.5–15.5)
WBC: 4.5 10*3/uL (ref 4.0–10.5)

## 2020-12-31 LAB — ALT: ALT: 15 U/L (ref 0–35)

## 2020-12-31 LAB — AST: AST: 17 U/L (ref 0–37)

## 2020-12-31 NOTE — Patient Instructions (Signed)
Health Maintenance, Female Adopting a healthy lifestyle and getting preventive care are important in promoting health and wellness. Ask your health care provider about:  The right schedule for you to have regular tests and exams.  Things you can do on your own to prevent diseases and keep yourself healthy. What should I know about diet, weight, and exercise? Eat a healthy diet  Eat a diet that includes plenty of vegetables, fruits, low-fat dairy products, and lean protein.  Do not eat a lot of foods that are high in solid fats, added sugars, or sodium.   Maintain a healthy weight Body mass index (BMI) is used to identify weight problems. It estimates body fat based on height and weight. Your health care provider can help determine your BMI and help you achieve or maintain a healthy weight. Get regular exercise Get regular exercise. This is one of the most important things you can do for your health. Most adults should:  Exercise for at least 150 minutes each week. The exercise should increase your heart rate and make you sweat (moderate-intensity exercise).  Do strengthening exercises at least twice a week. This is in addition to the moderate-intensity exercise.  Spend less time sitting. Even light physical activity can be beneficial. Watch cholesterol and blood lipids Have your blood tested for lipids and cholesterol at 40 years of age, then have this test every 5 years. Have your cholesterol levels checked more often if:  Your lipid or cholesterol levels are high.  You are older than 40 years of age.  You are at high risk for heart disease. What should I know about cancer screening? Depending on your health history and family history, you may need to have cancer screening at various ages. This may include screening for:  Breast cancer.  Cervical cancer.  Colorectal cancer.  Skin cancer.  Lung cancer. What should I know about heart disease, diabetes, and high blood  pressure? Blood pressure and heart disease  High blood pressure causes heart disease and increases the risk of stroke. This is more likely to develop in people who have high blood pressure readings, are of African descent, or are overweight.  Have your blood pressure checked: ? Every 3-5 years if you are 18-39 years of age. ? Every year if you are 40 years old or older. Diabetes Have regular diabetes screenings. This checks your fasting blood sugar level. Have the screening done:  Once every three years after age 40 if you are at a normal weight and have a low risk for diabetes.  More often and at a younger age if you are overweight or have a high risk for diabetes. What should I know about preventing infection? Hepatitis B If you have a higher risk for hepatitis B, you should be screened for this virus. Talk with your health care provider to find out if you are at risk for hepatitis B infection. Hepatitis C Testing is recommended for:  Everyone born from 1945 through 1965.  Anyone with known risk factors for hepatitis C. Sexually transmitted infections (STIs)  Get screened for STIs, including gonorrhea and chlamydia, if: ? You are sexually active and are younger than 40 years of age. ? You are older than 40 years of age and your health care provider tells you that you are at risk for this type of infection. ? Your sexual activity has changed since you were last screened, and you are at increased risk for chlamydia or gonorrhea. Ask your health care provider   if you are at risk.  Ask your health care provider about whether you are at high risk for HIV. Your health care provider may recommend a prescription medicine to help prevent HIV infection. If you choose to take medicine to prevent HIV, you should first get tested for HIV. You should then be tested every 3 months for as long as you are taking the medicine. Pregnancy  If you are about to stop having your period (premenopausal) and  you may become pregnant, seek counseling before you get pregnant.  Take 400 to 800 micrograms (mcg) of folic acid every day if you become pregnant.  Ask for birth control (contraception) if you want to prevent pregnancy. Osteoporosis and menopause Osteoporosis is a disease in which the bones lose minerals and strength with aging. This can result in bone fractures. If you are 65 years old or older, or if you are at risk for osteoporosis and fractures, ask your health care provider if you should:  Be screened for bone loss.  Take a calcium or vitamin D supplement to lower your risk of fractures.  Be given hormone replacement therapy (HRT) to treat symptoms of menopause. Follow these instructions at home: Lifestyle  Do not use any products that contain nicotine or tobacco, such as cigarettes, e-cigarettes, and chewing tobacco. If you need help quitting, ask your health care provider.  Do not use street drugs.  Do not share needles.  Ask your health care provider for help if you need support or information about quitting drugs. Alcohol use  Do not drink alcohol if: ? Your health care provider tells you not to drink. ? You are pregnant, may be pregnant, or are planning to become pregnant.  If you drink alcohol: ? Limit how much you use to 0-1 drink a day. ? Limit intake if you are breastfeeding.  Be aware of how much alcohol is in your drink. In the [place] one drink equals one 12 oz bottle of beer (355 mL), one 5 oz glass of wine (148 mL), or one 1 oz glass of hard liquor (44 mL). General instructions  Schedule regular health, dental, and eye exams.  Stay current with your vaccines.  Tell your health care provider if: ? You often feel depressed. ? You have ever been abused or do not feel safe at home. Summary  Adopting a healthy lifestyle and getting preventive care are important in promoting health and wellness.  Follow your health care provider's instructions about healthy  diet, exercising, and getting tested or screened for diseases.  Follow your health care provider's instructions on monitoring your cholesterol and blood pressure. This information is not intended to replace advice given to you by your health care provider. Make sure you discuss any questions you have with your health care provider. Document Revised: 10/11/2018 Document Reviewed: 10/11/2018 Elsevier Patient Education  2021 Elsevier Inc.  

## 2020-12-31 NOTE — Progress Notes (Signed)
Rose [name] is a 40 y.o. female  Chief Complaint  Patient presents with  . Establish Care    NP- CPE/labs. No concerns.  Declines flu shot today.    HPI: Rose [name] is a 40 y.o. female here to establish care with our office and for annual CPE, fasting labs.   Last PAP: UTD - follows with GYN Dr. Paula Compton  Diet/Exercise: overall healthy, regular exercise Dental: UTD - has upcoming appt Vision: does not wear glasses/contacts but is supposed to. Follows with optometry  Med refills needed today? none   Past Medical History:  Diagnosis Date  . Palpitations     Past Surgical History:  Procedure Laterality Date  . BACK SURGERY     x 2  . BREAST SURGERY    . CESAREAN SECTION N/A 01/29/2013   Procedure: Primary cesarean section with delivery of baby ;  Surgeon: Logan Bores, MD;  Location: [facility];  Service: Obstetrics;  Laterality: N/A;  Primary  . TONSILLECTOMY      Social History   Socioeconomic History  . Marital status: Married    Spouse name: Not on file  . Number of children: Not on file  . Years of education: Not on file  . Highest education level: Not on file  Occupational History  . Occupation: Hair Dresser  Tobacco Use  . Smoking status: Never Smoker  . Smokeless tobacco: Never Used  Vaping Use  . Vaping Use: Never used  Substance and Sexual Activity  . Alcohol use: No  . Drug use: No  . Sexual activity: Yes    Birth control/protection: None  Other Topics Concern  . Not on file  Social History Narrative   Lives at home with children and husband.    Social Determinants of Health   Financial Resource Strain: Not on file  Food Insecurity: Not on file  Transportation Needs: Not on file  Physical Activity: Not on file  Stress: Not on file  Social Connections: Not on file  Intimate Partner Violence: Not on file    Family History  Problem Relation Age of Onset  . Heart disease Father   . Cancer Father        throat  .  Thyroid disease Mother   . Other Daughter        Rett syndrome      Immunization History  Administered Date(s) Administered  . Tdap 11/10/2012    Outpatient Encounter Medications as of 12/31/2020  Medication Sig  . Cholecalciferol (VITAMIN D3) 1000 UNIT/SPRAY LIQD Take 2,000 Units by mouth daily.  . Ferrous Fumarate (IRON) 18 MG TBCR Take 18 mg by mouth daily. Take 1 tablet by mouth daily  . lactobacillus acidophilus (BACID) TABS tablet Take 1 tablet by mouth daily.   No facility-administered encounter medications on file as of 12/31/2020.     ROS: Gen: no fever, chills  Skin: no rash, itching ENT: no ear pain, ear drainage, nasal congestion, rhinorrhea, sinus pressure, sore throat Eyes: no blurry vision, double vision Resp: no cough, wheeze,SOB Breast: no breast tenderness, no nipple discharge, no breast masses CV: no CP, palpitations, LE edema,  GI: no heartburn, n/v/d/c, abd pain GU: no dysuria, urgency, frequency, hematuria MSK: no joint pain, myalgias, back pain Neuro: no dizziness, headache, weakness, vertigo Psych: no depression, anxiety, insomnia   Allergies  Allergen Reactions  . Tape Dermatitis    Severe skin burn from adhesive tape  . Codeine Nausea And Vomiting  Pt does not recall if she has taken percocet or vicodin.  Discharge summary from last pregnancy says she was given prescription for ibuprofen and percocet.    BP 110/68   Pulse 82   Temp 98.3 F (36.8 C) (Temporal)   Ht 5' 7.5" (1.715 m)   Wt 120 lb (54.4 kg)   BMI 18.52 kg/m   Wt Readings from Last 3 Encounters:  12/31/20 120 lb (54.4 kg)  09/30/18 116 lb 12 oz (53 kg)  08/24/18 117 lb (53.1 kg)   Temp Readings from Last 3 Encounters:  12/31/20 98.3 F (36.8 C) (Temporal)  09/30/18 97.6 F (36.4 C) (Oral)  08/24/18 98.9 F (37.2 C) (Oral)   BP Readings from Last 3 Encounters:  12/31/20 110/68  09/30/18 (!) 97/46  08/25/18 103/70   Pulse Readings from Last 3 Encounters:  12/31/20  82  09/30/18 88  08/25/18 89     Physical Exam Constitutional:      General: She is not in acute distress.    Appearance: She is well-developed and well-nourished.  HENT:     Head: Normocephalic and atraumatic.     Right Ear: Tympanic membrane and ear canal normal.     Left Ear: Tympanic membrane and ear canal normal.     Nose: Nose normal.     Mouth/Throat:     Mouth: Oropharynx is clear and moist and mucous membranes are normal. Mucous membranes are moist.     Pharynx: Oropharynx is clear.  Eyes:     Conjunctiva/sclera: Conjunctivae normal.  Neck:     Thyroid: No thyromegaly.  Cardiovascular:     Rate and Rhythm: Normal rate and regular rhythm.     Pulses: Intact distal pulses.     Heart sounds: Normal heart sounds. No murmur heard.   Pulmonary:     Effort: Pulmonary effort is normal. No respiratory distress.     Breath sounds: Normal breath sounds. No wheezing or rhonchi.  Abdominal:     General: Bowel sounds are normal. There is no distension.     Palpations: Abdomen is soft. There is no mass.     Tenderness: There is no abdominal tenderness.  Musculoskeletal:        General: No edema.     Cervical back: Neck supple.     Right lower leg: No edema.     Left lower leg: No edema.  Lymphadenopathy:     Cervical: No cervical adenopathy.  Skin:    General: Skin is warm and dry.  Neurological:     Mental Status: She is alert and oriented to person, place, and time.     Motor: No abnormal muscle tone.     Coordination: Coordination normal.  Psychiatric:        Mood and Affect: Mood and affect normal.        Behavior: Behavior normal.      A/P:  1. Annual physical exam - discussed importance of regular CV exercise, healthy diet, adequate sleep - UTD on PAP, mammo with GYN next year - declines flu vaccine - ALT - Basic metabolic panel - AST - CBC - next CPE in 1 year  2. Screening for lipid disorders - Lipid panel  3. Encounter for vitamin deficiency  screening - takes 2000IU daily - VITAMIN D 25 Hydroxy (Vit-D Deficiency, Fractures)  4. Influenza vaccination declined by patient  5. Family history of thyroid disease - TSH - T4, free    This visit occurred during the SARS-CoV-2  public health emergency.  Safety protocols were in place, including screening questions prior to the visit, additional usage of staff PPE, and extensive cleaning of exam room while observing appropriate contact time as indicated for disinfecting solutions.

## 2021-01-01 ENCOUNTER — Encounter: Payer: Self-pay | Admitting: Family Medicine

## 2021-01-01 LAB — VITAMIN D 25 HYDROXY (VIT D DEFICIENCY, FRACTURES): VITD: 50.89 ng/mL (ref 30.00–100.00)

## 2021-01-01 LAB — T4, FREE: Free T4: 0.77 ng/dL (ref 0.60–1.60)

## 2021-01-01 LAB — TSH: TSH: 1.3 u[IU]/mL (ref 0.35–4.50)

## 2021-02-24 ENCOUNTER — Encounter: Payer: Self-pay | Admitting: Family Medicine

## 2021-12-03 NOTE — Progress Notes (Signed)
[place] Phone: [phone] Subjective:   Rose [name], am serving as a scribe for Dr. Hulan Saas.This visit occurred during the SARS-CoV-2 public health emergency.  Safety protocols were in place, including screening questions prior to the visit, additional usage of staff PPE, and extensive cleaning of exam room while observing appropriate contact time as indicated for disinfecting solutions.  I'm seeing this patient by the request  of:  [name], Rose Brooke, NP  CC: back pain   [id number]  Rose [name] is a 41 y.o. female coming in with complaint of back pain. Patient states that she has had  surgeries on L5/S1 and L4/5. Patient stands all day and has a terminally ill 41 yo that weighs 80# that she has to lift. Patient sees Dr. Ellene [name] and had MRI in December has ruptured disc at L3.  This was independently visualized by me on CD that did show the patient does have an L3 and L4 nerve root impingement secondary to partial herniated disc versus arthropathy.  Had epidural 2 weeks ago. Pain is 2/10 after being 9/10. Patient has intermittent radiating symptoms into R leg. Tried Robaxin and felt that this made her drowsy. Did steroid in Oct that helped pain.   Xrays in 2004 showed previous hemilaminectomy at L4-S1.  Per notes does appear she has seen nuerosurgery in Dec 22     Past Medical History:  Diagnosis Date   Palpitations    Past Surgical History:  Procedure Laterality Date   BACK SURGERY     x 2   BREAST SURGERY     CESAREAN SECTION N/A 01/29/2013   Procedure: Primary cesarean section with delivery of baby ;  Surgeon: Rose Bores, MD;  Location: [facility];  Service: Obstetrics;  Laterality: N/A;  Primary   TONSILLECTOMY     Social History   Socioeconomic History   Marital status: Married    Spouse name: Not on file   Number of children: Not on file   Years of education: Not on file    Highest education level: Not on file  Occupational History   Occupation: Hair Dresser  Tobacco Use   Smoking status: Never   Smokeless tobacco: Never  Vaping Use   Vaping Use: Never used  Substance and Sexual Activity   Alcohol use: [name]   Drug use: [name]   Sexual activity: Yes    Birth control/protection: None  Other Topics Concern   Not on file  Social History Narrative   Lives at home with children and husband.    Social Determinants of Health   Financial Resource Strain: Not on file  Food Insecurity: Not on file  Transportation Needs: Not on file  Physical Activity: Not on file  Stress: Not on file  Social Connections: Not on file   Allergies  Allergen Reactions   Tape Dermatitis    Severe skin burn from adhesive tape   Codeine Nausea And Vomiting    Pt does not recall if she has taken percocet or vicodin.  Discharge summary from last pregnancy says she was given prescription for ibuprofen and percocet.   Family History  Problem Relation Age of Onset   Heart disease Father    Cancer Father        throat   Thyroid disease Mother    Other Daughter        Rett syndrome         Current  Outpatient Medications (Hematological):    Ferrous Fumarate (IRON) 18 MG TBCR, Take 18 mg by mouth daily. Take 1 tablet by mouth daily  Current Outpatient Medications (Other):    Cholecalciferol (VITAMIN D3) 1000 UNIT/SPRAY LIQD, Take 2,000 Units by mouth daily.   gabapentin (NEURONTIN) 100 MG capsule, Take 1 capsule (100 mg total) by mouth at bedtime.   lactobacillus acidophilus (BACID) TABS tablet, Take 1 tablet by mouth daily.   Reviewed prior external information including notes and imaging from  primary care provider As well as notes that were available from care everywhere and other healthcare systems.  Past medical history, social, surgical and family history all reviewed in electronic medical record.  [name] pertanent information unless stated regarding to the chief  complaint.   Review of Systems:  [name] headache, visual changes, nausea, vomiting, diarrhea, constipation, dizziness, abdominal pain, skin rash, fevers, chills, night sweats, weight loss, swollen lymph nodes, , joint swelling, chest pain, shortness of breath, mood changes. POSITIVE muscle aches, body aches  Objective  Blood pressure 110/70, pulse 76, height 5' 7.5" (1.715 m), weight 115 lb (52.2 kg), SpO2 98 %, unknown if currently breastfeeding.   General: [name] apparent distress alert and oriented x3 mood and affect normal, dressed appropriately.  HEENT: Pupils equal, extraocular movements intact  Respiratory: Patient's speak in full sentences and does not appear short of breath  Cardiovascular: [name] lower extremity edema, non tender, [name] erythema  Gait normal with good balance and coordination.  MSK:   Patient low back still has significant tightness more on the right side of the back.  Mild positive radicular symptoms in the L4 distribution on the right side.  Patient does have loss of lordosis of the back.  Patient does have mild weakness of the hip abductor strength right greater than left.   [id number]; 15 additional minutes spent for Therapeutic exercises as stated in above notes.  This included exercises focusing on stretching, strengthening, with significant focus on eccentric aspects.   Long term goals include an improvement in range of motion, strength, endurance as well as avoiding reinjury. Patient's frequency would include in 1-2 times a day, 3-5 times a week for a duration of 6-12 weeks.  Low back exercises that included:  Pelvic tilt/bracing instruction to focus on control of the pelvic girdle and lower abdominal muscles  Glute strengthening exercises, focusing on proper firing of the glutes without engaging the low back muscles Proper stretching techniques for maximum relief for the hamstrings, hip flexors, low back and some rotation where tolerated Proper technique shown and discussed  handout in great detail with ATC.  All questions were discussed and answered.     Impression and Recommendations:     The above documentation has been reviewed and is accurate and complete Lyndal Pulley, DO

## 2021-12-08 ENCOUNTER — Other Ambulatory Visit: Payer: Self-pay

## 2021-12-08 ENCOUNTER — Encounter: Payer: Self-pay | Admitting: Family Medicine

## 2021-12-08 ENCOUNTER — Ambulatory Visit (INDEPENDENT_AMBULATORY_CARE_PROVIDER_SITE_OTHER): Payer: 59 | Admitting: Family Medicine

## 2021-12-08 DIAGNOSIS — M5416 Radiculopathy, lumbar region: Secondary | ICD-10-CM | POA: Insufficient documentation

## 2021-12-08 MED ORDER — GABAPENTIN 100 MG PO CAPS: 100.0000 mg | ORAL_CAPSULE | Freq: Every day | ORAL | 0 refills | Status: DC

## 2021-12-08 NOTE — Assessment & Plan Note (Signed)
Patient does have lumbar radiculopathy.  And concerned that this could be contributing.  We discussed different treatment options.  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  We discussed icing regimen, gabapentin given the very low dose so patient could still potentially wake up without any significant difficulty. rtc in 4 weeks, can consider OMT.

## 2021-12-08 NOTE — Patient Instructions (Signed)
Exercises Gabapentin 100mg  at night Tart cherry extract 1200mg  at night Vit D 2000IU daily  Ok to do biking or elliptical See me again in 4 weeks but send and update in 2 weeks

## 2021-12-21 LAB — CBC: RBC: 4.56 (ref 3.87–5.11)

## 2021-12-21 LAB — CBC AND DIFFERENTIAL
HCT: 42 (ref 36–46)
Hemoglobin: 14 (ref 12.0–16.0)
Neutrophils Absolute: 4.5
Platelets: 284 10*3/uL (ref 150–400)
WBC: 6.7

## 2021-12-21 LAB — HEPATIC FUNCTION PANEL
ALT: 20 U/L (ref 7–35)
AST: 23 (ref 13–35)
Alkaline Phosphatase: 72 (ref 25–125)

## 2021-12-21 LAB — BASIC METABOLIC PANEL
BUN: 11 (ref 4–21)
CO2: 24 — AB (ref 13–22)
Chloride: 101 (ref 99–108)
Creatinine: 0.6 (ref 0.5–1.1)
Glucose: 88
Potassium: 4.4 mEq/L (ref 3.5–5.1)
Sodium: 139 (ref 137–147)

## 2021-12-21 LAB — IRON,TIBC AND FERRITIN PANEL
%SAT: 18
Iron: 61
TIBC: 339

## 2021-12-21 LAB — COMPREHENSIVE METABOLIC PANEL
Albumin: 4.2 (ref 3.5–5.0)
Calcium: 9.3 (ref 8.7–10.7)
Globulin: 2.7
eGFR: 118

## 2021-12-21 LAB — TSH: TSH: 0.79 (ref 0.41–5.90)

## 2021-12-21 LAB — VITAMIN D 25 HYDROXY (VIT D DEFICIENCY, FRACTURES): Vit D, 25-Hydroxy: 37.1

## 2021-12-29 LAB — HM PAP SMEAR: HM Pap smear: NORMAL

## 2022-01-01 ENCOUNTER — Encounter: Payer: Self-pay | Admitting: Nurse Practitioner

## 2022-01-04 ENCOUNTER — Other Ambulatory Visit: Payer: Self-pay

## 2022-01-04 NOTE — Progress Notes (Signed)
BP 98/60    Pulse 91    Temp (!) 97.1 F (36.2 C) (Temporal)    Ht 5' 7.5" (1.715 m)    Wt 117 lb 12.8 oz (53.4 kg)    SpO2 97%    BMI 18.18 kg/m    Subjective:    Patient ID: Rose [name], female    DOB: [birth year], 41 y.o.   MRN: [id number]   CC: Chief Complaint  Patient presents with   Transitions Of Care    TOC. Review lab results.     HPI: Rose [name] is a 41 y.o. female presenting on 01/05/2022 to transfer care to a new provider and for comprehensive medical examination. Current medical complaints include: back pain and wanting a referral to cardiology.  She has a history of 2 lumbar back surgeries from burst discs when she was in her 56s. She has not had back pain since until this past October. 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 She thinks she may have injured her back again from lifting her daughter. She went to neurosurgery and saw Dr. Ellene Route and had an MRI which showed a ruptured L3-4 disc. She got steroid injection in January and she has been pain free since this injection.   She went to her OB/GYN last week. She had abnormal mammogram a week ago. Her maternal grandmother died of breast cancer in her 65s She would like to be tested for the BRCA gene.   She endorses history of mitral valve prolapse and tachycardia. Her heart rate sitting is in the 80s-90s and when standing, its 110s. She saw an EP cardiologist and was told that she could take a medication to lower her heart rate, however she did not want to take any medications. She endorses ongoing, intermittent palpitations. She would like to see a cardiologist and her family sees Dr. Martinique. She drinks a lot of water at home which is what she was told at her last visit.   She currently lives with: husband 2 kids Menopausal Symptoms: no  Depression Screen done today and results listed below:  Depression screen [facility] 2/9 01/05/2022 12/31/2020  Decreased Interest 0 0  Down,  Depressed, Hopeless 0 0  PHQ - 2 Score 0 0  Altered sleeping 1 -  Tired, decreased energy 1 -  Change in appetite 0 -  Feeling bad or failure about yourself  0 -  Trouble concentrating 0 -  Moving slowly or fidgety/restless 0 -  Suicidal thoughts 0 -  PHQ-9 Score 2 -    The patient does not have a history of falls. I did not complete a risk assessment for falls. A plan of care for falls was not documented.   Past Medical History:  Past Medical History:  Diagnosis Date   Degenerative disc disease, lumbar    Palpitations    Subchorionic hemorrhage 07/17/2012    Surgical History:  Past Surgical History:  Procedure Laterality Date   BACK SURGERY     x 2   BREAST SURGERY     CESAREAN SECTION N/A 01/29/2013   Procedure: Primary cesarean section with delivery of baby ;  Surgeon: Logan Bores, MD;  Location: [facility];  Service: Obstetrics;  Laterality: N/A;  Primary   TONSILLECTOMY      Medications:  Current Outpatient Medications on File Prior to Visit  Medication Sig   Cholecalciferol (VITAMIN D3) 1000 UNIT/SPRAY LIQD Take 2,000 Units by mouth  daily.   Ferrous Fumarate (IRON) 18 MG TBCR Take 18 mg by mouth daily. Take 1 tablet by mouth daily   gabapentin (NEURONTIN) 100 MG capsule Take 1 capsule (100 mg total) by mouth at bedtime. (Patient not taking: Reported on 01/05/2022)   tiZANidine (ZANAFLEX) 4 MG capsule take 1 capsule by oral route 3 times  a day as needed for muscle spasm (Patient not taking: Reported on 01/05/2022)   No current facility-administered medications on file prior to visit.    Allergies:  Allergies  Allergen Reactions   Tape Dermatitis    Severe skin burn from adhesive tape   Codeine Nausea And Vomiting    Pt does not recall if she has taken percocet or vicodin.  Discharge summary from last pregnancy says she was given prescription for ibuprofen and percocet.    Social History:  Social History   Socioeconomic History   Marital status: Married     Spouse name: Not on file   Number of children: Not on file   Years of education: Not on file   Highest education level: Not on file  Occupational History   Occupation: Hair Dresser  Tobacco Use   Smoking status: Never   Smokeless tobacco: Never  Vaping Use   Vaping Use: Never used  Substance and Sexual Activity   Alcohol use: No   Drug use: No   Sexual activity: Yes    Birth control/protection: Other-see comments    Comment: husband vasectomy  Other Topics Concern   Not on file  Social History Narrative   Lives at home with children and husband.    Social Determinants of Health   Financial Resource Strain: Not on file  Food Insecurity: Not on file  Transportation Needs: Not on file  Physical Activity: Not on file  Stress: Not on file  Social Connections: Not on file  Intimate Partner Violence: Not on file   Social History   Tobacco Use  Smoking Status Never  Smokeless Tobacco Never   Social History   Substance and Sexual Activity  Alcohol Use No    Family History:  Family History  Problem Relation Age of Onset   Thyroid disease Mother    Heart disease Father    Cancer Father        throat   Other Daughter        Rett syndrome    Cancer Maternal Grandmother        breast    Past medical history, surgical history, medications, allergies, family history and social history reviewed with patient today and changes made to appropriate areas of the chart.   Review of Systems  Constitutional: Negative.   HENT: Negative.    Eyes: Negative.   Respiratory: Negative.    Cardiovascular:  Positive for palpitations. Negative for chest pain.  Gastrointestinal: Negative.   Genitourinary: Negative.   Musculoskeletal: Negative.   Skin: Negative.   Neurological:  Positive for dizziness (intermittent with changing positions). Negative for headaches.  Psychiatric/Behavioral: Negative.    All other ROS negative except what is listed above and in the HPI.       Objective:    BP 98/60    Pulse 91    Temp (!) 97.1 F (36.2 C) (Temporal)    Ht 5' 7.5" (1.715 m)    Wt 117 lb 12.8 oz (53.4 kg)    SpO2 97%    BMI 18.18 kg/m   Wt Readings from Last 3 Encounters:  01/05/22 117 lb 12.8  oz (53.4 kg)  12/08/21 115 lb (52.2 kg)  12/31/20 120 lb (54.4 kg)    Physical Exam Vitals and nursing note reviewed.  Constitutional:      General: She is not in acute distress.    Appearance: Normal appearance.  HENT:     Head: Normocephalic and atraumatic.     Right Ear: Tympanic membrane, ear canal and external ear normal.     Left Ear: Tympanic membrane, ear canal and external ear normal.     Nose: Nose normal.     Mouth/Throat:     Mouth: Mucous membranes are moist.     Pharynx: Oropharynx is clear.  Eyes:     Conjunctiva/sclera: Conjunctivae normal.  Cardiovascular:     Rate and Rhythm: Normal rate and regular rhythm.     Pulses: Normal pulses.     Heart sounds: Normal heart sounds.  Pulmonary:     Effort: Pulmonary effort is normal.     Breath sounds: Normal breath sounds.  Abdominal:     General: Bowel sounds are normal.     Palpations: Abdomen is soft.     Tenderness: There is no abdominal tenderness.  Musculoskeletal:        General: Normal range of motion.     Cervical back: Normal range of motion. No tenderness.     Right lower leg: No edema.     Left lower leg: No edema.  Lymphadenopathy:     Cervical: No cervical adenopathy.  Skin:    General: Skin is warm and dry.  Neurological:     General: No focal deficit present.     Mental Status: She is alert and oriented to person, place, and time.     Cranial Nerves: No cranial nerve deficit.     Coordination: Coordination normal.     Gait: Gait normal.  Psychiatric:        Mood and Affect: Mood normal.        Behavior: Behavior normal.        Thought Content: Thought content normal.        Judgment: Judgment normal.    Results for orders placed or performed in visit on 01/05/22  CRP  High sensitivity  Result Value Ref Range   CRP, High Sensitivity 1.510 0.000 - 5.000 mg/L  Lipid panel  Result Value Ref Range   Cholesterol 168 0 - 200 mg/dL   Triglycerides 68.0 0.0 - 149.0 mg/dL   HDL 73.80 >39.00 mg/dL   VLDL 13.6 0.0 - 40.0 mg/dL   LDL Cholesterol 81 0 - 99 mg/dL   Total CHOL/HDL Ratio 2    NonHDL 94.19   Iron, TIBC and Ferritin Panel  Result Value Ref Range   Iron 61    TIBC 339    %SAT 18   CBC and differential  Result Value Ref Range   Hemoglobin 14.0 12.0 - 16.0   HCT 42 36 - 46   Neutrophils Absolute 4.50    Platelets 284 150 - 400 K/uL   WBC 6.7   CBC  Result Value Ref Range   RBC 4.56 3.87 - 5.11  VITAMIN D 25 Hydroxy (Vit-D Deficiency, Fractures)  Result Value Ref Range   Vit D, 25-Hydroxy 44.3   Basic metabolic panel  Result Value Ref Range   Glucose 88    BUN 11 4 - 21   CO2 24 (A) 13 - 22   Creatinine 0.6 0.5 - 1.1   Potassium 4.4 3.5 - 5.1 mEq/L  Sodium 139 137 - 147   Chloride 101 99 - 108  Comprehensive metabolic panel  Result Value Ref Range   Globulin 2.7    eGFR 118    Calcium 9.3 8.7 - 10.7   Albumin 4.2 3.5 - 5.0  Hepatic function panel  Result Value Ref Range   Alkaline Phosphatase 72 25 - 125   ALT 20 7 - 35 U/L   AST 23 13 - 35  TSH  Result Value Ref Range   TSH 0.79 0.41 - 5.90      Assessment & Plan:   Problem List Items Addressed This Visit       Cardiovascular and Mediastinum   Mitral valve prolapse    Chronic, saw Dr. Curt Bears with EP due to palpitations and elevated heart rate. She was offered heart rate lowering medication and declined at the time. She has been drinking plenty of water. Her palpitations still come and go and she would like a referral back to cardiology along with her high sensitivity CRP being elevated. Her family sees Dr. Martinique and would like a referral to see him. Referral placed.       Relevant Orders   Ambulatory referral to Cardiology     Other   Chronic back pain    Chronic  back pain with history of lumbar surgery x2 in her 41s. She was recently diagnosed with a ruptured L3-L4 disc. She received a spinal injection in January which significantly helped with her pain. Continue collaboration and recommendations from neurosurgery.       Relevant Medications   tiZANidine (ZANAFLEX) 4 MG capsule   PALPITATIONS    Chronic, saw Dr. Curt Bears with EP due to palpitations and elevated heart rate. She was offered heart rate lowering medication and declined at the time. She has been drinking plenty of water. Her palpitations still come and go and she would like a referral back to cardiology along with her high sensitivity CRP being elevated. Her family sees Dr. Martinique and would like a referral to see him. Referral placed.       Relevant Orders   Ambulatory referral to Cardiology   CRP elevated    Cardiac CRP elevated at 7.59. Will repeat today along with lipid panel. Discussed that HS-CRP is 1 risk factor for heart disease and not entire picture. Will treat based on results of lipid panel, HS-CRP, and ASCVD score. Referral placed to cardiology per patient request.       Relevant Orders   CRP High sensitivity (Completed)   Other Visit Diagnoses     Routine general medical examination at a health care facility    -  Primary   Health maintenance reviewed and updated. Recent labs reviewed from [facility] including CMP, CBC, TSH, and high sensitivity CRP.    Encounter for lipid screening for cardiovascular disease       Check lipid panel today and treat based on results.    Relevant Orders   Lipid panel (Completed)   Family history of breast cancer       She would like to be tested for BRCA genetic testing. Discussed pros and cons of testing. She would like to proceed. Will also send results to GYN.    Relevant Orders   BRCA Panel (BRCA1, BRCA2)   Encounter for hepatitis C screening test for low risk patient       Screen for hepatitis C today   Relevant Orders   Hepatitis C  antibody  Follow up plan: Return in about 1 year (around 01/06/2023) for CPE.   LABORATORY TESTING:  - Pap smear: done elsewhere  IMMUNIZATIONS:   - Tdap: Tetanus vaccination status reviewed: last tetanus booster within 10 years. - Influenza: Refused - Pneumovax: Not applicable - Prevnar: Not applicable - HPV: Not applicable - Zostavax vaccine: Not applicable  SCREENING: -Mammogram: Done elsewhere  - Colonoscopy: Not applicable  - Bone Density: Not applicable  -Hearing Test: Not applicable  -Spirometry: Not applicable   PATIENT COUNSELING:   Advised to take 1 mg of folate supplement per day if capable of pregnancy.   Sexuality: Discussed sexually transmitted diseases, partner selection, use of condoms, avoidance of unintended pregnancy  and contraceptive alternatives.   Advised to avoid cigarette smoking.  I discussed with the patient that most people either abstain from alcohol or drink within safe limits (<=14/week and <=4 drinks/occasion for males, <=7/weeks and <= 3 drinks/occasion for females) and that the risk for alcohol disorders and other health effects rises proportionally with the number of drinks per week and how often a drinker exceeds daily limits.  Discussed cessation/primary prevention of drug use and availability of treatment for abuse.   Diet: Encouraged to adjust caloric intake to maintain  or achieve ideal body weight, to reduce intake of dietary saturated fat and total fat, to limit sodium intake by avoiding high sodium foods and not adding table salt, and to maintain adequate dietary potassium and calcium preferably from fresh fruits, vegetables, and low-fat dairy products.    stressed the importance of regular exercise  Injury prevention: Discussed safety belts, safety helmets, smoke detector, smoking near bedding or upholstery.   Dental health: Discussed importance of regular tooth brushing, flossing, and dental visits.    NEXT PREVENTATIVE  PHYSICAL DUE IN 1 YEAR. Return in about 1 year (around 01/06/2023) for CPE.

## 2022-01-05 ENCOUNTER — Encounter: Payer: Self-pay | Admitting: Nurse Practitioner

## 2022-01-05 ENCOUNTER — Ambulatory Visit (INDEPENDENT_AMBULATORY_CARE_PROVIDER_SITE_OTHER): Payer: 59 | Admitting: Nurse Practitioner

## 2022-01-05 VITALS — BP 98/60 | HR 91 | Temp 97.1°F | Ht 67.5 in | Wt 117.8 lb

## 2022-01-05 DIAGNOSIS — Z13220 Encounter for screening for lipoid disorders: Secondary | ICD-10-CM

## 2022-01-05 DIAGNOSIS — M545 Low back pain, unspecified: Secondary | ICD-10-CM

## 2022-01-05 DIAGNOSIS — G8929 Other chronic pain: Secondary | ICD-10-CM

## 2022-01-05 DIAGNOSIS — Z1159 Encounter for screening for other viral diseases: Secondary | ICD-10-CM

## 2022-01-05 DIAGNOSIS — R7982 Elevated C-reactive protein (CRP): Secondary | ICD-10-CM

## 2022-01-05 DIAGNOSIS — R002 Palpitations: Secondary | ICD-10-CM

## 2022-01-05 DIAGNOSIS — Z803 Family history of malignant neoplasm of breast: Secondary | ICD-10-CM

## 2022-01-05 DIAGNOSIS — Z136 Encounter for screening for cardiovascular disorders: Secondary | ICD-10-CM

## 2022-01-05 DIAGNOSIS — I341 Nonrheumatic mitral (valve) prolapse: Secondary | ICD-10-CM

## 2022-01-05 DIAGNOSIS — Z0000 Encounter for general adult medical examination without abnormal findings: Secondary | ICD-10-CM

## 2022-01-05 LAB — LIPID PANEL
Cholesterol: 168 mg/dL (ref 0–200)
HDL: 73.8 mg/dL (ref >39.00)
LDL Cholesterol: 81 mg/dL (ref 0–99)
NonHDL: 94.19
Total CHOL/HDL Ratio: 2
Triglycerides: 68 mg/dL (ref 0.0–149.0)
VLDL: 13.6 mg/dL (ref 0.0–40.0)

## 2022-01-05 LAB — HIGH SENSITIVITY CRP: CRP, High Sensitivity: 1.51 mg/L (ref 0.000–5.000)

## 2022-01-05 NOTE — Assessment & Plan Note (Signed)
Chronic, saw Dr. Curt Bears with EP due to palpitations and elevated heart rate. She was offered heart rate lowering medication and declined at the time. She has been drinking plenty of water. Her palpitations still come and go and she would like a referral back to cardiology along with her high sensitivity CRP being elevated. Her family sees Dr. Martinique and would like a referral to see him. Referral placed.  ?

## 2022-01-05 NOTE — Assessment & Plan Note (Signed)
Chronic back pain with history of lumbar surgery x2 in her 67s. She was recently diagnosed with a ruptured L3-L4 disc. She received a spinal injection in January which significantly helped with her pain. Continue collaboration and recommendations from neurosurgery.  ?

## 2022-01-05 NOTE — Assessment & Plan Note (Addendum)
Cardiac CRP elevated at 7.59. Will repeat today along with lipid panel. Discussed that HS-CRP is 1 risk factor for heart disease and not entire picture. Will treat based on results of lipid panel, HS-CRP, and ASCVD score. Referral placed to cardiology per patient request.  ?

## 2022-01-05 NOTE — Patient Instructions (Signed)
It was great to see you! ? ?I have ordered some labs today and will send the results via mychart/phone. ? ?I have placed a referral to cardiology.  ? ?Let's follow-up in 1 year, sooner if you have concerns. ? ?If a referral was placed today, you will be contacted for an appointment. Please note that routine referrals can sometimes take up to 3-4 weeks to process. Please call our office if you haven't heard anything after this time frame. ? ?Take care, ? ?Vance Peper, NP ? ?

## 2022-01-06 ENCOUNTER — Encounter: Payer: 59 | Admitting: Nurse Practitioner

## 2022-01-06 ENCOUNTER — Encounter: Payer: 59 | Admitting: Family Medicine

## 2022-01-08 ENCOUNTER — Telehealth: Payer: Self-pay | Admitting: Nurse Practitioner

## 2022-01-08 NOTE — Telephone Encounter (Signed)
[facility] is requesting a prior authorization for genetic lab test.. BRCA  ? ?CB [phone] ?

## 2022-01-11 NOTE — Telephone Encounter (Addendum)
Called and left patient detailed vm. Provider is unable to order test due to patient's ins. Pt needs to f/u with her health ins provider ? ?

## 2022-01-11 NOTE — Telephone Encounter (Signed)
[facility] is about the status of their prior authrorization request in regards to the  BRCA Panel test ordered. ? ?CB#  [phone] ? ?Thank you ? ?

## 2022-01-12 ENCOUNTER — Ambulatory Visit: Payer: 59 | Admitting: Family Medicine

## 2022-01-14 ENCOUNTER — Telehealth: Payer: Self-pay

## 2022-01-14 NOTE — Telephone Encounter (Signed)
A user error has taken place: encounter opened in error, closed for administrative reasons.

## 2022-01-14 NOTE — Telephone Encounter (Signed)
[facility] is needing OV notes from [facility] 01/05/22 faxed to [phone]. ?

## 2022-01-14 NOTE — Telephone Encounter (Signed)
Called and spoke to rep from [facility]. Rep confirmed PA form will be emailed to be reviewed by provider and faxed.  ?

## 2022-01-14 NOTE — Telephone Encounter (Deleted)
PAPERWORK COMPLETE AND FAXED TO [employer]PROP[employer]ATE PARTY. Sw, cma ?

## 2022-01-27 ENCOUNTER — Other Ambulatory Visit: Payer: Self-pay

## 2022-02-12 ENCOUNTER — Telehealth: Payer: Self-pay | Admitting: Nurse Practitioner

## 2022-02-12 NOTE — Telephone Encounter (Signed)
Ins co called for prior authorization [phone] for the order from her last visit. (So sorry I could not find it in her chart.) ?She said she could handle it for you just needs the heads up. ?

## 2022-02-15 NOTE — Telephone Encounter (Signed)
Left VM to rtn call with patients number. Dm/cma ? ?

## 2022-02-17 ENCOUNTER — Ambulatory Visit: Payer: 59 | Admitting: Cardiology

## 2022-02-19 ENCOUNTER — Telehealth: Payer: Self-pay | Admitting: Nurse Practitioner

## 2022-02-19 LAB — BRCA PANEL (BRCA1, BRCA2)
CLINICAL INTERPRETATION: NEGATIVE
RESULT: NEGATIVE

## 2022-02-19 LAB — HEPATITIS C ANTIBODY
Hepatitis C Ab: NONREACTIVE
SIGNAL TO CUT-OFF: <0.02 (ref <1.00)

## 2022-02-19 NOTE — Telephone Encounter (Signed)
Pt is wanting a call back concerning her BRCA PANEL (BRCA1. BRA2) Test. Please advise pt at [phone] ? ?

## 2022-02-19 NOTE — Telephone Encounter (Signed)
Called and ldvm. If patient cb please let them know testing lab of BRCA PANEL machine is down, but oncew we receive results someone will reach out to her. Sw, cma ? ?

## 2022-03-04 NOTE — Telephone Encounter (Signed)
FYI: Pt called stating nobody has reached out about labs yet... She expected a call back and was not aware of the MyChart messages from [facility]. She saw the messages and was aware of results & satisfied when getting off of the phone.  ? ? ?

## 2022-04-09 NOTE — Progress Notes (Unsigned)
Cardiology Office Note   Date:  04/09/2022   ID:  Rose [name], DOB [birth year], MRN [id number]  PCP:  Rose Dancer, NP  Cardiologist:   Rose Fridman Martinique, MD   No chief complaint on file.     History of Present Illness: Rose [name] is a 41 y.o. female who is seen at the request of Rose Peper NP for evaluation of palpitations. She was seen previously in 2019 by Dr Rose [name]. She has a history of mitral valve prolapse and tachycardia.  Echo in 2010 showed a normal ejection fraction.  Monitor 2018 showed sinus rhythm and sinus tachycardia with heart rates of 60-120. PVCs. Echo in 2019 was normal. Holter at that time showed NSR with sinus tachy and rare PVCs. 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     Past Medical History:  Diagnosis Date   Degenerative disc disease, lumbar    Palpitations    Subchorionic hemorrhage 07/17/2012    Past Surgical History:  Procedure Laterality Date   BACK SURGERY     x 2   BREAST SURGERY     CESAREAN SECTION N/A 01/29/2013   Procedure: Primary cesarean section with delivery of baby ;  Surgeon: Rose Bores, MD;  Location: [facility];  Service: Obstetrics;  Laterality: N/A;  Primary   TONSILLECTOMY       Current Outpatient Medications  Medication Sig Dispense Refill   Cholecalciferol (VITAMIN D3) 1000 UNIT/SPRAY LIQD Take 2,000 Units by mouth daily.     Ferrous Fumarate (IRON) 18 MG TBCR Take 18 mg by mouth daily. Take 1 tablet by mouth daily     gabapentin (NEURONTIN) 100 MG capsule Take 1 capsule (100 mg total) by mouth at bedtime. (Patient not taking: Reported on 01/05/2022) 90 capsule 0   tiZANidine (ZANAFLEX) 4 MG capsule take 1 capsule by oral route 3 times  a day as needed for muscle spasm (Patient not taking: Reported on 01/05/2022)     No current facility-administered medications for this visit.    Allergies:   Tape and Codeine    Social History:  The patient   reports that she has never smoked. She has never used smokeless tobacco. She reports that she does not drink alcohol and does not use drugs.   Family History:  The patient's ***family history includes Cancer in her father and maternal grandmother; Heart disease in her father; Other in her daughter; Thyroid disease in her mother.    ROS:  Please see the history of present illness.   Otherwise, review of systems are positive for {NONE DEFAULTED:18576}.   All other systems are reviewed and negative.    PHYSICAL EXAM: VS:  There were no vitals taken for this visit. , BMI There is no height or weight on file to calculate BMI. GEN: Well nourished, well developed, in no acute distress HEENT: normal Neck: no JVD, carotid bruits, or masses Cardiac: ***RRR; no murmurs, rubs, or gallops,no edema  Respiratory:  clear to auscultation bilaterally, normal work of breathing GI: soft, nontender, nondistended, + BS MS: no deformity or atrophy Skin: warm and dry, no rash Neuro:  Strength and sensation are intact Psych: euthymic mood, full affect   EKG:  EKG {ACTION; IS/IS LOV:56433295} ordered today. The ekg ordered today demonstrates ***   Recent Labs: 12/21/2021: ALT 20; BUN 11; Creatinine 0.6; Hemoglobin 14.0; Platelets 284; Potassium 4.4; Sodium 139; TSH 0.79    Lipid Panel    Component  Value Date/Time   CHOL 168 01/05/2022 1109   TRIG 68.0 01/05/2022 1109   HDL 73.80 01/05/2022 1109   CHOLHDL 2 01/05/2022 1109   VLDL 13.6 01/05/2022 1109   LDLCALC 81 01/05/2022 1109      Wt Readings from Last 3 Encounters:  01/05/22 117 lb 12.8 oz (53.4 kg)  12/08/21 115 lb (52.2 kg)  12/31/20 120 lb (54.4 kg)      Other studies Reviewed: Additional studies/ records that were reviewed today include:   Holter 08/29/18: Study Highlights  Normal sinus rhythm and sinus tachycardia with heart rate range from 60 to 120bpm. Occasional PVC   Echo 08/22/18: Study Conclusions   - Left ventricle: The  cavity size was normal. Wall thickness was    normal. Systolic function was normal. The estimated ejection    fraction was in the range of 60% to 65%. Wall motion was normal;    there were no regional wall motion abnormalities. Left    ventricular diastolic function parameters were normal.  ASSESSMENT AND PLAN:  1.  ***   Current medicines are reviewed at length with the patient today.  The patient {ACTIONS; HAS/DOES NOT HAVE:19233} concerns regarding medicines.  The following changes have been made:  {PLAN; NO CHANGE:13088:s}  Labs/ tests ordered today include: *** No orders of the defined types were placed in this encounter.        Disposition:   FU with *** in {gen number 2-56:389373} {Days to years:10300}  Signed, Yamil Oelke Martinique, MD  04/09/2022 2:07 PM    [facility] [address] [place], [place], [address] Phone [phone], Fax [phone]

## 2022-04-13 ENCOUNTER — Encounter: Payer: Self-pay | Admitting: Cardiology

## 2022-04-13 ENCOUNTER — Ambulatory Visit (INDEPENDENT_AMBULATORY_CARE_PROVIDER_SITE_OTHER): Payer: 59 | Admitting: Cardiology

## 2022-04-13 VITALS — BP 100/70 | HR 73 | Ht 67.0 in | Wt 119.8 lb

## 2022-04-13 DIAGNOSIS — R000 Tachycardia, unspecified: Secondary | ICD-10-CM | POA: Diagnosis not present

## 2022-04-13 DIAGNOSIS — I341 Nonrheumatic mitral (valve) prolapse: Secondary | ICD-10-CM | POA: Diagnosis not present

## 2022-04-13 NOTE — Patient Instructions (Signed)
  Follow-Up: At [facility], you and your health needs are our priority.  As part of our continuing mission to provide you with exceptional heart care, we have created designated Provider Care Teams.  These Care Teams include your primary Cardiologist (physician) and Advanced Practice Providers (APPs -  Physician Assistants and Nurse Practitioners) who all work together to provide you with the care you need, when you need it.  We recommend signing up for the patient portal called "MyChart".  Sign up information is provided on this After Visit Summary.  MyChart is used to connect with patients for Virtual Visits (Telemedicine).  Patients are able to view lab/test results, encounter notes, upcoming appointments, etc.  Non-urgent messages can be sent to your provider as well.   To learn more about what you can do with MyChart, go to [email or web address].    Your next appointment:   12 month(s)  The format for your next appointment:   In Person  Provider:   PETER Martinique MD    Important Information About Sugar

## 2022-05-24 ENCOUNTER — Emergency Department (HOSPITAL_BASED_OUTPATIENT_CLINIC_OR_DEPARTMENT_OTHER)
Admission: EM | Admit: 2022-05-24 | Discharge: 2022-05-24 | Disposition: A | Discharge status: Home / Self Care | Payer: 59 | Attending: Emergency Medicine | Admitting: Emergency Medicine

## 2022-05-24 ENCOUNTER — Encounter (HOSPITAL_BASED_OUTPATIENT_CLINIC_OR_DEPARTMENT_OTHER): Payer: Self-pay | Admitting: Emergency Medicine

## 2022-05-24 ENCOUNTER — Emergency Department (HOSPITAL_BASED_OUTPATIENT_CLINIC_OR_DEPARTMENT_OTHER): Payer: 59

## 2022-05-24 ENCOUNTER — Other Ambulatory Visit: Payer: Self-pay

## 2022-05-24 DIAGNOSIS — N9489 Other specified conditions associated with female genital organs and menstrual cycle: Secondary | ICD-10-CM | POA: Insufficient documentation

## 2022-05-24 DIAGNOSIS — R072 Precordial pain: Secondary | ICD-10-CM

## 2022-05-24 DIAGNOSIS — R102 Pelvic and perineal pain: Secondary | ICD-10-CM | POA: Insufficient documentation

## 2022-05-24 DIAGNOSIS — N924 Excessive bleeding in the premenopausal period: Secondary | ICD-10-CM | POA: Diagnosis not present

## 2022-05-24 DIAGNOSIS — N938 Other specified abnormal uterine and vaginal bleeding: Secondary | ICD-10-CM | POA: Diagnosis present

## 2022-05-24 DIAGNOSIS — N921 Excessive and frequent menstruation with irregular cycle: Secondary | ICD-10-CM

## 2022-05-24 LAB — CBC WITH DIFFERENTIAL/PLATELET
Abs Immature Granulocytes: 0.01 10*3/uL (ref 0.00–0.07)
Basophils Absolute: 0 10*3/uL (ref 0.0–0.1)
Basophils Relative: 0 %
Eosinophils Absolute: 0.1 10*3/uL (ref 0.0–0.5)
Eosinophils Relative: 1 %
HCT: 37.5 % (ref 36.0–46.0)
Hemoglobin: 12.7 g/dL (ref 12.0–15.0)
Immature Granulocytes: 0 %
Lymphocytes Relative: 36 %
Lymphs Abs: 2 10*3/uL (ref 0.7–4.0)
MCH: 31 pg (ref 26.0–34.0)
MCHC: 33.9 g/dL (ref 30.0–36.0)
MCV: 91.5 fL (ref 80.0–100.0)
Monocytes Absolute: 0.4 10*3/uL (ref 0.1–1.0)
Monocytes Relative: 6 %
Neutro Abs: 3.1 10*3/uL (ref 1.7–7.7)
Neutrophils Relative %: 57 %
Platelets: 234 10*3/uL (ref 150–400)
RBC: 4.1 MIL/uL (ref 3.87–5.11)
RDW: 13 % (ref 11.5–15.5)
WBC: 5.6 10*3/uL (ref 4.0–10.5)
nRBC: 0 % (ref 0.0–0.2)

## 2022-05-24 LAB — BASIC METABOLIC PANEL
Anion gap: 6 (ref 5–15)
BUN: 11 mg/dL (ref 6–20)
CO2: 29 mmol/L (ref 22–32)
Calcium: 9 mg/dL (ref 8.9–10.3)
Chloride: 105 mmol/L (ref 98–111)
Creatinine, Ser: 0.6 mg/dL (ref 0.44–1.00)
GFR, Estimated: >60 mL/min (ref >60)
Glucose, Bld: 102 mg/dL — ABNORMAL HIGH (ref 70–99)
Potassium: 3.5 mmol/L (ref 3.5–5.1)
Sodium: 140 mmol/L (ref 135–145)

## 2022-05-24 LAB — HCG, SERUM, QUALITATIVE: Preg, Serum: NEGATIVE

## 2022-05-24 LAB — TROPONIN I (HIGH SENSITIVITY): Troponin I (High Sensitivity): <2 ng/L (ref <18)

## 2022-05-24 MED ORDER — ALUM & MAG HYDROXIDE-SIMETH 200-200-20 MG/5ML PO SUSP: 30.0000 mL | Freq: Once | ORAL | Status: DC

## 2022-05-24 MED ORDER — SODIUM CHLORIDE 0.9 % IV BOLUS
500.0000 mL | Freq: Once | INTRAVENOUS | Status: AC
  Administered 2022-05-24: 500 mL via INTRAVENOUS

## 2022-05-24 NOTE — ED Provider Notes (Signed)
[facility] EMERGENCY DEPARTMENT Provider Note   CSN: [id number] Arrival date & time: 05/24/22  0416     History  Chief Complaint  Patient presents with   Vaginal Bleeding    Rose [name] is a 41 y.o. female.  The history is provided by the patient and the spouse.  Vaginal Bleeding Quality:  Dark red and clots Severity:  Moderate Onset quality:  Gradual Duration:  2 weeks Timing:  Constant Progression:  Worsening Chronicity:  New Menstrual history:  Irregular Possible pregnancy: no   Context: spontaneously   Relieved by:  Nothing Worsened by:  Nothing Ineffective treatments:  None tried Associated symptoms: no abdominal pain and no fever   Associated symptoms comment:  Having chest pain in response to the vaginal bleeding.  Risk factors: no bleeding disorder        Home Medications Prior to Admission medications   Medication Sig Start Date End Date Taking? Authorizing Provider  Cholecalciferol (VITAMIN D3) 1000 UNIT/SPRAY LIQD Take 2,000 Units by mouth daily.    [provider]  Multiple Vitamin (MULTIVITAMIN) LIQD Take 5 mLs by mouth daily.    [provider]      Allergies    Tape and Codeine    Review of Systems   Review of Systems  Constitutional:  Negative for fever.  HENT:  Negative for facial swelling.   Eyes:  Negative for redness.  Respiratory:  Negative for shortness of breath.   Cardiovascular:  Positive for chest pain. Negative for palpitations and leg swelling.  Gastrointestinal:  Negative for abdominal pain.  Genitourinary:  Positive for vaginal bleeding.  All other systems reviewed and are negative.   Physical Exam Updated Vital Signs BP 127/73 (BP Location: Right Arm)   Pulse 97   Resp 18   Ht '5\' 7"'$  (1.702 m)   Wt 54.4 kg   LMP 05/10/2022   SpO2 100%   BMI 18.79 kg/m  Physical Exam Nursing note reviewed. Exam conducted with a chaperone present.  Constitutional:      General: She is not in acute  distress.    Appearance: Normal appearance. She is well-developed.  HENT:     Head: Normocephalic and atraumatic.     Nose: Nose normal.  Eyes:     Pupils: Pupils are equal, round, and reactive to light.  Cardiovascular:     Rate and Rhythm: Normal rate and regular rhythm.     Pulses: Normal pulses.     Heart sounds: Normal heart sounds.  Pulmonary:     Effort: Pulmonary effort is normal. No respiratory distress.     Breath sounds: Normal breath sounds.  Abdominal:     General: Bowel sounds are normal. There is no distension.     Palpations: Abdomen is soft.     Tenderness: There is no abdominal tenderness. There is no guarding or rebound.  Genitourinary:    Vagina: No vaginal discharge.  Musculoskeletal:        General: Normal range of motion.     Cervical back: Neck supple.  Skin:    General: Skin is dry.     Capillary Refill: Capillary refill takes less than 2 seconds.     Findings: No erythema or rash.  Neurological:     General: No focal deficit present.     Mental Status: She is alert and oriented to person, place, and time.     Deep Tendon Reflexes: Reflexes normal.  Psychiatric:  Thought Content: Thought content normal.     ED Results / Procedures / Treatments   Labs (all labs ordered are listed, but only abnormal results are displayed) Results for orders placed or performed during the hospital encounter of 05/24/22  CBC with Differential  Result Value Ref Range   WBC 5.6 4.0 - 10.5 K/uL   RBC 4.10 3.87 - 5.11 MIL/uL   Hemoglobin 12.7 12.0 - 15.0 g/dL   HCT 37.5 36.0 - 46.0 %   MCV 91.5 80.0 - 100.0 fL   MCH 31.0 26.0 - 34.0 pg   MCHC 33.9 30.0 - 36.0 g/dL   RDW 13.0 11.5 - 15.5 %   Platelets 234 150 - 400 K/uL   nRBC 0.0 0.0 - 0.2 %   Neutrophils Relative % 57 %   Neutro Abs 3.1 1.7 - 7.7 K/uL   Lymphocytes Relative 36 %   Lymphs Abs 2.0 0.7 - 4.0 K/uL   Monocytes Relative 6 %   Monocytes Absolute 0.4 0.1 - 1.0 K/uL   Eosinophils Relative 1 %    Eosinophils Absolute 0.1 0.0 - 0.5 K/uL   Basophils Relative 0 %   Basophils Absolute 0.0 0.0 - 0.1 K/uL   Immature Granulocytes 0 %   Abs Immature Granulocytes 0.01 0.00 - 0.07 K/uL  Basic metabolic panel  Result Value Ref Range   Sodium 140 135 - 145 mmol/L   Potassium 3.5 3.5 - 5.1 mmol/L   Chloride 105 98 - 111 mmol/L   CO2 29 22 - 32 mmol/L   Glucose, Bld 102 (H) 70 - 99 mg/dL   BUN 11 6 - 20 mg/dL   Creatinine, Ser 0.60 0.44 - 1.00 mg/dL   Calcium 9.0 8.9 - 10.3 mg/dL   GFR, Estimated >60 >60 mL/min   Anion gap 6 5 - 15  hCG, serum, qualitative  Result Value Ref Range   Preg, Serum NEGATIVE NEGATIVE   No results found.  EKG EKG Interpretation  Date/Time:  Monday May 24 2022 04:32:08 EDT Ventricular Rate:  79 PR Interval:  167 QRS Duration: 86 QT Interval:  384 QTC Calculation: 441 R Axis:   85 Text Interpretation: Sinus rhythm Confirmed by Randal Buba, Teyana Pierron ([id number]) on 05/24/2022 4:46:26 AM  Radiology No results found.  Procedures Procedures    Medications Ordered in ED Medications  sodium chloride 0.9 % bolus 500 mL (500 mLs Intravenous New Bag/Given 05/24/22 0457)    ED Course/ Medical Decision Making/ A&P                           Medical Decision Making Patient with irregular cycles and 14 days of bleeding with clots in the last 3 days   Problems Addressed: Menorrhagia with irregular cycle:    Details: follow up with your GYN for ongoing care  Amount and/or Complexity of Data Reviewed Independent Historian: spouse    Details: see above External Data Reviewed: notes.    Details: previous notes reviewed Labs: ordered.    Details: all labs reviewed:  normal white count,  hemoglobin is normal 12.7, normal platelet count.  Normal sodium and potassium.  Normal creatinine .6 .  Normal troponin. Negative pregnancy test Radiology: ordered and independent interpretation performed.    Details: negative by me ECG/medicine tests: ordered and independent  interpretation performed. Decision-making details documented in ED Course. Discussion of management or test interpretation with external provider(s): Case d/w Dr. Rogue Bussing on call for Dr. Marvel Plan, please do not  start medications.  Please have patient call office for evaluation and ultrasound and further management.    Risk OTC drugs. Risk Details: PERC negative wells negative highly doubt PE in this low risk patient. I do not believe this is ACS.  Negative troponin and ED ekg.  I suspect there is a component of anxiety related to the stress of the menorrhagia.  Stable for discharge with close follow up with GYN.      Final Clinical Impression(s) / ED Diagnoses Final diagnoses:  Menorrhagia with irregular cycle  Precordial pain  Return for intractable cough, coughing up blood, fevers > 100.4 unrelieved by medication, shortness of breath, intractable vomiting, chest pain, shortness of breath, weakness, numbness, changes in speech, facial asymmetry, abdominal pain, passing out, Inability to tolerate liquids or food, cough, altered mental status or any concerns. No signs of systemic illness or infection. The patient is nontoxic-appearing on exam and vital signs are within normal limits.  I have reviewed the triage vital signs and the nursing notes. Pertinent labs & imaging results that were available during my care of the patient were reviewed by me and considered in my medical decision making (see chart for details). After history, exam, and medical workup I feel the patient has been appropriately medically screened and is safe for discharge home. Pertinent diagnoses were discussed with the patient. Patient was given return precautions.   Rx / DC Orders ED Discharge Orders     None         Raahil Ong, MD 05/24/22 8115

## 2022-05-24 NOTE — ED Triage Notes (Signed)
Pt reports 14 days of vaginal bleeding and the last 72 hrs very heavy bleeding with large clots. Hx of irregular cycles, OB plans ultrasound that hasn't been done yet. Pt reports intermittent chest pain that she is not having right now. Also mentions HR being in the 50s. Husband has had a vasectomy but did not attend f/u appts.

## 2022-05-29 ENCOUNTER — Other Ambulatory Visit: Payer: Self-pay

## 2022-05-29 ENCOUNTER — Emergency Department (HOSPITAL_COMMUNITY): Payer: 59

## 2022-05-29 ENCOUNTER — Observation Stay (HOSPITAL_COMMUNITY)
Admission: EM | Admit: 2022-05-29 | Discharge: 2022-05-31 | Disposition: A | Discharge status: Home / Self Care | Payer: 59 | Attending: Obstetrics and Gynecology | Admitting: Obstetrics and Gynecology

## 2022-05-29 DIAGNOSIS — D500 Iron deficiency anemia secondary to blood loss (chronic): Principal | ICD-10-CM | POA: Insufficient documentation

## 2022-05-29 DIAGNOSIS — N939 Abnormal uterine and vaginal bleeding, unspecified: Secondary | ICD-10-CM | POA: Diagnosis present

## 2022-05-29 DIAGNOSIS — D649 Anemia, unspecified: Secondary | ICD-10-CM | POA: Diagnosis present

## 2022-05-29 DIAGNOSIS — N920 Excessive and frequent menstruation with regular cycle: Secondary | ICD-10-CM

## 2022-05-29 LAB — CBC WITH DIFFERENTIAL/PLATELET
Abs Immature Granulocytes: 0.02 10*3/uL (ref 0.00–0.07)
Basophils Absolute: 0 10*3/uL (ref 0.0–0.1)
Basophils Relative: 1 %
Eosinophils Absolute: 0 10*3/uL (ref 0.0–0.5)
Eosinophils Relative: 0 %
HCT: 21.8 % — ABNORMAL LOW (ref 36.0–46.0)
Hemoglobin: 7.6 g/dL — ABNORMAL LOW (ref 12.0–15.0)
Immature Granulocytes: 0 %
Lymphocytes Relative: 32 %
Lymphs Abs: 1.8 10*3/uL (ref 0.7–4.0)
MCH: 32.5 pg (ref 26.0–34.0)
MCHC: 34.9 g/dL (ref 30.0–36.0)
MCV: 93.2 fL (ref 80.0–100.0)
Monocytes Absolute: 0.4 10*3/uL (ref 0.1–1.0)
Monocytes Relative: 7 %
Neutro Abs: 3.3 10*3/uL (ref 1.7–7.7)
Neutrophils Relative %: 60 %
Platelets: 207 10*3/uL (ref 150–400)
RBC: 2.34 MIL/uL — ABNORMAL LOW (ref 3.87–5.11)
RDW: 13.4 % (ref 11.5–15.5)
WBC: 5.6 10*3/uL (ref 4.0–10.5)
nRBC: 0 % (ref 0.0–0.2)

## 2022-05-29 LAB — CBC
HCT: 30.8 % — ABNORMAL LOW (ref 36.0–46.0)
Hemoglobin: 10.3 g/dL — ABNORMAL LOW (ref 12.0–15.0)
MCH: 31.8 pg (ref 26.0–34.0)
MCHC: 33.4 g/dL (ref 30.0–36.0)
MCV: 95.1 fL (ref 80.0–100.0)
Platelets: 310 10*3/uL (ref 150–400)
RBC: 3.24 MIL/uL — ABNORMAL LOW (ref 3.87–5.11)
RDW: 13.2 % (ref 11.5–15.5)
WBC: 7.1 10*3/uL (ref 4.0–10.5)
nRBC: 0 % (ref 0.0–0.2)

## 2022-05-29 LAB — COMPREHENSIVE METABOLIC PANEL
ALT: 22 U/L (ref 0–44)
AST: 28 U/L (ref 15–41)
Albumin: 3.9 g/dL (ref 3.5–5.0)
Alkaline Phosphatase: 43 U/L (ref 38–126)
Anion gap: 8 (ref 5–15)
BUN: 9 mg/dL (ref 6–20)
CO2: 23 mmol/L (ref 22–32)
Calcium: 8.9 mg/dL (ref 8.9–10.3)
Chloride: 107 mmol/L (ref 98–111)
Creatinine, Ser: 0.67 mg/dL (ref 0.44–1.00)
GFR, Estimated: >60 mL/min (ref >60)
Glucose, Bld: 91 mg/dL (ref 70–99)
Potassium: 3.5 mmol/L (ref 3.5–5.1)
Sodium: 138 mmol/L (ref 135–145)
Total Bilirubin: 0.5 mg/dL (ref 0.3–1.2)
Total Protein: 6.5 g/dL (ref 6.5–8.1)

## 2022-05-29 LAB — I-STAT BETA HCG BLOOD, ED (MC, WL, AP ONLY): I-stat hCG, quantitative: <5 m[IU]/mL (ref <5)

## 2022-05-29 LAB — PROTIME-INR
INR: 1.2 (ref 0.8–1.2)
Prothrombin Time: 14.9 seconds (ref 11.4–15.2)

## 2022-05-29 LAB — CBG MONITORING, ED: Glucose-Capillary: 71 mg/dL (ref 70–99)

## 2022-05-29 LAB — PREPARE RBC (CROSSMATCH)

## 2022-05-29 MED ORDER — ONDANSETRON HCL 4 MG PO TABS: 4.0000 mg | ORAL_TABLET | Freq: Four times a day (QID) | ORAL | Status: DC | PRN

## 2022-05-29 MED ORDER — MORPHINE SULFATE (PF) 4 MG/ML IV SOLN
4.0000 mg | Freq: Once | INTRAVENOUS | Status: AC
  Administered 2022-05-29: 4 mg via INTRAVENOUS
  Filled 2022-05-29: qty 1

## 2022-05-29 MED ORDER — ONDANSETRON HCL 4 MG/2ML IJ SOLN: 4.0000 mg | Freq: Four times a day (QID) | INTRAMUSCULAR | Status: DC | PRN

## 2022-05-29 MED ORDER — LACTATED RINGERS IV BOLUS
1000.0000 mL | Freq: Once | INTRAVENOUS | Status: AC
  Administered 2022-05-29: 1000 mL via INTRAVENOUS

## 2022-05-29 MED ORDER — PRENATAL MULTIVITAMIN CH
1.0000 | ORAL_TABLET | Freq: Every day | ORAL | Status: DC
  Administered 2022-05-30: 1 via ORAL
  Filled 2022-05-29: qty 1

## 2022-05-29 MED ORDER — SODIUM CHLORIDE 0.9 % IV SOLN: 10.0000 mL/h | Freq: Once | INTRAVENOUS | Status: DC

## 2022-05-29 MED ORDER — TRANEXAMIC ACID-NACL 1000-0.7 MG/100ML-% IV SOLN
1000.0000 mg | INTRAVENOUS | Status: AC
  Administered 2022-05-29: 1000 mg via INTRAVENOUS
  Filled 2022-05-29: qty 100

## 2022-05-29 MED ORDER — IBUPROFEN 800 MG PO TABS
800.0000 mg | ORAL_TABLET | Freq: Three times a day (TID) | ORAL | Status: DC | PRN
  Administered 2022-05-29 – 2022-05-30 (×2): 800 mg via ORAL
  Filled 2022-05-29 (×2): qty 1

## 2022-05-29 MED ORDER — LACTATED RINGERS IV SOLN: INTRAVENOUS | Status: DC

## 2022-05-29 MED ORDER — TRANEXAMIC ACID 650 MG PO TABS
1300.0000 mg | ORAL_TABLET | Freq: Three times a day (TID) | ORAL | Status: DC
  Administered 2022-05-30 (×3): 1300 mg via ORAL
  Filled 2022-05-29 (×5): qty 2

## 2022-05-29 MED ORDER — ONDANSETRON HCL 4 MG/2ML IJ SOLN
4.0000 mg | Freq: Once | INTRAMUSCULAR | Status: AC
  Administered 2022-05-29: 4 mg via INTRAVENOUS
  Filled 2022-05-29: qty 2

## 2022-05-29 NOTE — H&P (Signed)
Rose [name] is an 41 y.o. female [phone] who presented to ED with recurrent heavy vaginal bleeding and feeling lightheaded.  Pt was seen in the office 05/24/22 for ongoing issue with menorrhagia.  She reported that heavier menses started about 6 months ago, but had been getting progressively worse and longer.   It got so heavy she went to the ED 05/23/22  in [facility]. Hgb at that time was 12+. UPT negative in ED and husband with vasectomy. EMB performed in the office returned benign proliferative endometrium.  She was started on provera '10mg'$  daily  Pertinent Gynecological History:  OB History: NSVD x 1 and c-section x 1                    SAB x 1   Menstrual History:  Patient's last menstrual period was 05/29/2022 (exact date).    Past Medical History:  Diagnosis Date   Degenerative disc disease, lumbar    Palpitations    Subchorionic hemorrhage 07/17/2012    Past Surgical History:  Procedure Laterality Date   BACK SURGERY     x 2   BREAST SURGERY     CESAREAN SECTION N/A 01/29/2013   Procedure: Primary cesarean section with delivery of baby ;  Surgeon: Logan Bores, MD;  Location: [facility];  Service: Obstetrics;  Laterality: N/A;  Primary   TONSILLECTOMY      Family History  Problem Relation Age of Onset   Thyroid disease Mother    Heart disease Father    Cancer Father        throat   Other Daughter        Rett syndrome    Cancer Maternal Grandmother        breast    Social History:  reports that she has never smoked. She has never used smokeless tobacco. She reports that she does not drink alcohol and does not use drugs.  Allergies:  Allergies  Allergen Reactions   Tape Dermatitis    Severe skin burn from adhesive tape   Codeine Nausea And Vomiting    Pt does not recall if she has taken percocet or vicodin.  Discharge summary from last pregnancy says she was given prescription for ibuprofen and percocet.    (Not in a hospital admission)   Review of  Systems  Gastrointestinal:  Negative for abdominal pain.  Genitourinary:  Positive for vaginal bleeding.  Neurological:  Positive for dizziness.    Blood pressure (!) 103/52, pulse 93, temperature 98 F (36.7 C), temperature source Oral, resp. rate 15, last menstrual period 05/29/2022, SpO2 98 %, unknown if currently breastfeeding. Physical Exam Cardiovascular:     Rate and Rhythm: Normal rate and regular rhythm.  Pulmonary:     Effort: Pulmonary effort is normal.  Abdominal:     Palpations: Abdomen is soft.  Genitourinary:    General: Normal vulva.  Neurological:     General: No focal deficit present.  Psychiatric:        Mood and Affect: Mood normal.     Results for orders placed or performed during the hospital encounter of 05/29/22 (from the past 24 hour(s))  Comprehensive metabolic panel     Status: None   Collection Time: 05/29/22 12:05 PM  Result Value Ref Range   Sodium 138 135 - 145 mmol/L   Potassium 3.5 3.5 - 5.1 mmol/L   Chloride 107 98 - 111 mmol/L   CO2 23 22 - 32 mmol/L   Glucose,  Bld 91 70 - 99 mg/dL   BUN 9 6 - 20 mg/dL   Creatinine, Ser 0.67 0.44 - 1.00 mg/dL   Calcium 8.9 8.9 - 10.3 mg/dL   Total Protein 6.5 6.5 - 8.1 g/dL   Albumin 3.9 3.5 - 5.0 g/dL   AST 28 15 - 41 U/L   ALT 22 0 - 44 U/L   Alkaline Phosphatase 43 38 - 126 U/L   Total Bilirubin 0.5 0.3 - 1.2 mg/dL   GFR, Estimated >60 >60 mL/min   Anion gap 8 5 - 15  CBC     Status: Abnormal   Collection Time: 05/29/22 12:05 PM  Result Value Ref Range   WBC 7.1 4.0 - 10.5 K/uL   RBC 3.24 (L) 3.87 - 5.11 MIL/uL   Hemoglobin 10.3 (L) 12.0 - 15.0 g/dL   HCT 30.8 (L) 36.0 - 46.0 %   MCV 95.1 80.0 - 100.0 fL   MCH 31.8 26.0 - 34.0 pg   MCHC 33.4 30.0 - 36.0 g/dL   RDW 13.2 11.5 - 15.5 %   Platelets 310 150 - 400 K/uL   nRBC 0.0 0.0 - 0.2 %  Type and screen [place]     Status: None   Collection Time: 05/29/22 12:05 PM  Result Value Ref Range   ABO/RH(D) A POS     Antibody Screen NEG    Sample Expiration      06/01/2022,2359 Performed at [facility], [place] [address] [place], [place] [address]   I-Stat beta hCG blood, ED     Status: None   Collection Time: 05/29/22 12:18 PM  Result Value Ref Range   I-stat hCG, quantitative <5.0 <5 mIU/mL   Comment 3          Protime-INR     Status: None   Collection Time: 05/29/22 12:48 PM  Result Value Ref Range   Prothrombin Time 14.9 11.4 - 15.2 seconds   INR 1.2 0.8 - 1.2  CBC with Differential     Status: Abnormal (Preliminary result)   Collection Time: 05/29/22  7:30 PM  Result Value Ref Range   WBC 5.6 4.0 - 10.5 K/uL   RBC 2.34 (L) 3.87 - 5.11 MIL/uL   Hemoglobin 7.6 (L) 12.0 - 15.0 g/dL   HCT 21.8 (L) 36.0 - 46.0 %   MCV 93.2 80.0 - 100.0 fL   MCH 32.5 26.0 - 34.0 pg   MCHC 34.9 30.0 - 36.0 g/dL   RDW 13.4 11.5 - 15.5 %   Platelets 207 150 - 400 K/uL   nRBC 0.0 0.0 - 0.2 %   Neutrophils Relative % PENDING %   Neutro Abs PENDING 1.7 - 7.7 K/uL   Band Neutrophils PENDING %   Lymphocytes Relative PENDING %   Lymphs Abs PENDING 0.7 - 4.0 K/uL   Monocytes Relative PENDING %   Monocytes Absolute PENDING 0.1 - 1.0 K/uL   Eosinophils Relative PENDING %   Eosinophils Absolute PENDING 0.0 - 0.5 K/uL   Basophils Relative PENDING %   Basophils Absolute PENDING 0.0 - 0.1 K/uL   WBC Morphology PENDING    RBC Morphology PENDING    Smear Review PENDING    Other PENDING %   nRBC PENDING 0 /100 WBC   Metamyelocytes Relative PENDING %   Myelocytes PENDING %   Promyelocytes Relative PENDING %   Blasts PENDING %   Immature Granulocytes PENDING %   Abs Immature Granulocytes PENDING 0.00 - 0.07  K/uL  CBG monitoring, ED     Status: None   Collection Time: 05/29/22  7:50 PM  Result Value Ref Range   Glucose-Capillary 71 70 - 99 mg/dL    US Pelvis Complete  Result Date: 05/29/2022 CLINICAL DATA:  41 year old female with abnormal heavy vaginal bleeding. EXAM: TRANSABDOMINAL AND TRANSVAGINAL  ULTRASOUND OF PELVIS TECHNIQUE: Both transabdominal and transvaginal ultrasound examinations of the pelvis were performed. Transabdominal technique was performed for global imaging of the pelvis including uterus, ovaries, adnexal regions, and pelvic cul-de-sac. It was necessary to proceed with endovaginal exam following the transabdominal exam to visualize the ovaries and endometrium. COMPARISON:  None Available. FINDINGS: Uterus Measurements: 9.3 x 5.2 x 5.9 cm = volume: 148 mL. Heterogeneous echotexture noted. A 0.6 cm intramural LEFT uterine body fibroid is noted. Endometrium Thickness: 6 mm. A small amount of fluid/blood within the endometrial canal is noted. Right ovary Measurements: 5 x 2.7 x 3.9 cm = volume: 29 mL. A 3.7 x 2.2 x 3 cm simple appearing ovarian cyst is noted. Left ovary Measurements: 2.8 x 0.8 x 1.5 cm = volume: 1.9 mL. Normal appearance/no adnexal mass. Other findings A small amount of free pelvic fluid is noted. IMPRESSION: 1. Normal endometrial thickness. Fluid/blood within the endometrial canal noted. 2. 0.6 cm intramural LEFT uterine body fibroid. 3. 3.7 cm simple appearing RIGHT ovarian cyst. No follow up imaging recommended. Note: This recommendation does not apply to premenarchal patients or to those with increased risk (genetic, family history, elevated tumor markers or other high-risk factors) of ovarian cancer. Reference: Radiology 2019 Nov; 293(2):359-371. 4. Unremarkable LEFT ovary. 5. Small amount of free pelvic fluid, nonspecific but may be physiologic. Electronically Signed   By: Margarette Canada M.D.   On: 05/29/2022 16:30   US Transvaginal Non-OB  Result Date: 05/29/2022 CLINICAL DATA:  41 year old female with abnormal heavy vaginal bleeding. EXAM: TRANSABDOMINAL AND TRANSVAGINAL ULTRASOUND OF PELVIS TECHNIQUE: Both transabdominal and transvaginal ultrasound examinations of the pelvis were performed. Transabdominal technique was performed for global imaging of the pelvis  including uterus, ovaries, adnexal regions, and pelvic cul-de-sac. It was necessary to proceed with endovaginal exam following the transabdominal exam to visualize the ovaries and endometrium. COMPARISON:  None Available. FINDINGS: Uterus Measurements: 9.3 x 5.2 x 5.9 cm = volume: 148 mL. Heterogeneous echotexture noted. A 0.6 cm intramural LEFT uterine body fibroid is noted. Endometrium Thickness: 6 mm. A small amount of fluid/blood within the endometrial canal is noted. Right ovary Measurements: 5 x 2.7 x 3.9 cm = volume: 29 mL. A 3.7 x 2.2 x 3 cm simple appearing ovarian cyst is noted. Left ovary Measurements: 2.8 x 0.8 x 1.5 cm = volume: 1.9 mL. Normal appearance/no adnexal mass. Other findings A small amount of free pelvic fluid is noted. IMPRESSION: 1. Normal endometrial thickness. Fluid/blood within the endometrial canal noted. 2. 0.6 cm intramural LEFT uterine body fibroid. 3. 3.7 cm simple appearing RIGHT ovarian cyst. No follow up imaging recommended. Note: This recommendation does not apply to premenarchal patients or to those with increased risk (genetic, family history, elevated tumor markers or other high-risk factors) of ovarian cancer. Reference: Radiology 2019 Nov; 293(2):359-371. 4. Unremarkable LEFT ovary. 5. Small amount of free pelvic fluid, nonspecific but may be physiologic. Electronically Signed   By: Margarette Canada M.D.   On: 05/29/2022 16:30    Assessment/Plan: Pt with symptomatic anemia and heavy vaginal bleeding failing provera.  She is started on TXA and will continue that.  Received 1 gram IV  in ED.  Hgb is now at 7.6 and she is symptomatic with her acute blood loss so ED ordering 1 unit of blood and we will see how bleeding improves overnight with TXA.   Logan Bores 05/29/2022, 8:16 PM

## 2022-05-29 NOTE — ED Notes (Signed)
Admitting provider at bedside.

## 2022-05-29 NOTE — ED Triage Notes (Signed)
Pt reports heavy vaginal bleeding for several weeks, but has been significantly worse. Was placed on Progesterone on Wednesday and had an endometrial biospy with normal results. Pt is bleeding through a super plus tampon every fifteen minutes with multiple large clots coming out each time she removes one. Endorses abdominal pain and generalized weakness.

## 2022-05-29 NOTE — ED Notes (Signed)
PT states nausea.  Provider notified.

## 2022-05-29 NOTE — ED Provider Notes (Signed)
[place] EMERGENCY DEPARTMENT Provider Note   CSN: [id number] Arrival date & time: 05/29/22  1145     History  Chief Complaint  Patient presents with   Vaginal Bleeding    Rose [name] is a 41 y.o. female with a past medical history of lumbar degenerative disc disease, mitral valve prolapse, intermittent tachycardia, who presents today for evaluation of vaginal bleeding. She states that her symptoms have been ongoing for about 2 to 3 weeks now and constant.  She feels like her vaginal bleeding is getting heavier.  She states that she is currently changing a super plus tampon about every 15 minutes and when she does change her tampon she is having multiple palm sized clots.  She states that she went to her OB/GYN on the 25th, after being seen in the ER on the 24th for this, they did an endometrial biopsy that was normal, started her on p.o. progesterone, and scheduled her for a saline ultrasound next month. She states that she has not had any change in her symptoms since starting the p.o. progesterone.  She denies any personal history of breast or ovarian cancer.  No history of coagulopathy.  No history of DVT.  She reports that briefly after her endometrial biopsy her bleeding lightened, however then returned to being heavy.  She denies any trauma or concern for injury or tear since she was seen by OB/GYN.    HPI     Home Medications Prior to Admission medications   Medication Sig Start Date End Date Taking? Authorizing Provider  ibuprofen (ADVIL) 600 MG tablet Take 600 mg by mouth every 8 (eight) hours as needed for moderate pain. 05/27/22  Yes [provider]  progesterone (PROMETRIUM) 100 MG capsule Take 100 mg by mouth in the morning and at bedtime. 05/25/22  Yes [provider]      Allergies    Tape and Codeine    Review of Systems   Review of Systems  Physical Exam Updated Vital Signs BP (!) 103/52   Pulse 93   Temp 98 F (36.7  C) (Oral)   Resp 15   LMP 05/29/2022 (Exact Date)   SpO2 98%  Physical Exam Vitals and nursing note reviewed.  Constitutional:      General: She is not in acute distress.    Comments: Appears pale and ill however is not an extremis or distress  HENT:     Head: Atraumatic.  Eyes:     Conjunctiva/sclera: Conjunctivae normal.  Cardiovascular:     Rate and Rhythm: Tachycardia present.  Pulmonary:     Effort: Pulmonary effort is normal. No respiratory distress.  Abdominal:     General: There is no distension.     Tenderness: There is no abdominal tenderness.  Musculoskeletal:     Cervical back: Normal range of motion and neck supple.     Right lower leg: No edema.     Left lower leg: No edema.     Comments: No obvious acute injury  Skin:    General: Skin is warm.  Neurological:     General: No focal deficit present.     Mental Status: She is alert.     Comments: Awake and alert, answers all questions appropriately.  Speech is not slurred.  Psychiatric:        Mood and Affect: Mood normal.        Behavior: Behavior normal.     ED Results / Procedures / Treatments  Labs (all labs ordered are listed, but only abnormal results are displayed) Labs Reviewed  CBC - Abnormal; Notable for the following components:      Result Value   RBC 3.24 (*)    Hemoglobin 10.3 (*)    HCT 30.8 (*)    All other components within normal limits  CBC WITH DIFFERENTIAL/PLATELET - Abnormal; Notable for the following components:   RBC 2.34 (*)    Hemoglobin 7.6 (*)    HCT 21.8 (*)    All other components within normal limits  COMPREHENSIVE METABOLIC PANEL  PROTIME-INR  CBC  I-STAT BETA HCG BLOOD, ED ([facility], [facility], [facility] ONLY)  CBG MONITORING, ED  TYPE AND SCREEN  PREPARE RBC (CROSSMATCH)    EKG EKG Interpretation  Date/Time:  Saturday May 29 2022 12:25:16 EDT Ventricular Rate:  107 PR Interval:  169 QRS Duration: 67 QT Interval:  350 QTC Calculation: 467 R Axis:   87 Text  Interpretation: Sinus tachycardia Right atrial enlargement RSR' in V1 or V2, probably normal variant Since last tracing rate faster Confirmed by Isla Pence [phone]) on 05/29/2022 12:27:42 PM  Radiology US Pelvis Complete  Result Date: 05/29/2022 CLINICAL DATA:  41 year old female with abnormal heavy vaginal bleeding. EXAM: TRANSABDOMINAL AND TRANSVAGINAL ULTRASOUND OF PELVIS TECHNIQUE: Both transabdominal and transvaginal ultrasound examinations of the pelvis were performed. Transabdominal technique was performed for global imaging of the pelvis including uterus, ovaries, adnexal regions, and pelvic cul-de-sac. It was necessary to proceed with endovaginal exam following the transabdominal exam to visualize the ovaries and endometrium. COMPARISON:  None Available. FINDINGS: Uterus Measurements: 9.3 x 5.2 x 5.9 cm = volume: 148 mL. Heterogeneous echotexture noted. A 0.6 cm intramural LEFT uterine body fibroid is noted. Endometrium Thickness: 6 mm. A small amount of fluid/blood within the endometrial canal is noted. Right ovary Measurements: 5 x 2.7 x 3.9 cm = volume: 29 mL. A 3.7 x 2.2 x 3 cm simple appearing ovarian cyst is noted. Left ovary Measurements: 2.8 x 0.8 x 1.5 cm = volume: 1.9 mL. Normal appearance/no adnexal mass. Other findings A small amount of free pelvic fluid is noted. IMPRESSION: 1. Normal endometrial thickness. Fluid/blood within the endometrial canal noted. 2. 0.6 cm intramural LEFT uterine body fibroid. 3. 3.7 cm simple appearing RIGHT ovarian cyst. No follow up imaging recommended. Note: This recommendation does not apply to premenarchal patients or to those with increased risk (genetic, family history, elevated tumor markers or other high-risk factors) of ovarian cancer. Reference: Radiology 2019 Nov; 293(2):359-371. 4. Unremarkable LEFT ovary. 5. Small amount of free pelvic fluid, nonspecific but may be physiologic. Electronically Signed   By: Margarette Canada M.D.   On: 05/29/2022 16:30    US Transvaginal Non-OB  Result Date: 05/29/2022 CLINICAL DATA:  41 year old female with abnormal heavy vaginal bleeding. EXAM: TRANSABDOMINAL AND TRANSVAGINAL ULTRASOUND OF PELVIS TECHNIQUE: Both transabdominal and transvaginal ultrasound examinations of the pelvis were performed. Transabdominal technique was performed for global imaging of the pelvis including uterus, ovaries, adnexal regions, and pelvic cul-de-sac. It was necessary to proceed with endovaginal exam following the transabdominal exam to visualize the ovaries and endometrium. COMPARISON:  None Available. FINDINGS: Uterus Measurements: 9.3 x 5.2 x 5.9 cm = volume: 148 mL. Heterogeneous echotexture noted. A 0.6 cm intramural LEFT uterine body fibroid is noted. Endometrium Thickness: 6 mm. A small amount of fluid/blood within the endometrial canal is noted. Right ovary Measurements: 5 x 2.7 x 3.9 cm = volume: 29 mL. A 3.7 x 2.2 x 3 cm simple  appearing ovarian cyst is noted. Left ovary Measurements: 2.8 x 0.8 x 1.5 cm = volume: 1.9 mL. Normal appearance/no adnexal mass. Other findings A small amount of free pelvic fluid is noted. IMPRESSION: 1. Normal endometrial thickness. Fluid/blood within the endometrial canal noted. 2. 0.6 cm intramural LEFT uterine body fibroid. 3. 3.7 cm simple appearing RIGHT ovarian cyst. No follow up imaging recommended. Note: This recommendation does not apply to premenarchal patients or to those with increased risk (genetic, family history, elevated tumor markers or other high-risk factors) of ovarian cancer. Reference: Radiology 2019 Nov; 293(2):359-371. 4. Unremarkable LEFT ovary. 5. Small amount of free pelvic fluid, nonspecific but may be physiologic. Electronically Signed   By: Margarette Canada M.D.   On: 05/29/2022 16:30    Procedures .Critical Care  Performed by: Lorin Glass, PA-C Authorized by: Lorin Glass, PA-C   Critical care provider statement:    Critical care time (minutes):  75    Critical care time was exclusive of:  Separately billable procedures and treating other patients   Critical care was necessary to treat or prevent imminent or life-threatening deterioration of the following conditions:  Shock and circulatory failure   Critical care was time spent personally by me on the following activities:  Development of treatment plan with patient or surrogate, discussions with consultants, evaluation of patient's response to treatment, examination of patient, ordering and review of laboratory studies, ordering and review of radiographic studies, ordering and performing treatments and interventions, pulse oximetry, re-evaluation of patient's condition, review of old charts and obtaining history from patient or surrogate   I assumed direction of critical care for this patient from another provider in my specialty: no     Care discussed with: admitting provider   Comments:     Progressive anemia, tachycardia, soft blood pressures requiring blood transfusion in the setting of ongoing blood loss     Medications Ordered in ED Medications  prenatal multivitamin tablet 1 tablet (has no administration in time range)  lactated ringers infusion (has no administration in time range)  ibuprofen (ADVIL) tablet 800 mg (has no administration in time range)  ondansetron (ZOFRAN) tablet 4 mg (has no administration in time range)    Or  ondansetron (ZOFRAN) injection 4 mg (has no administration in time range)  tranexamic acid (LYSTEDA) tablet 1,300 mg (has no administration in time range)  0.9 %  sodium chloride infusion (has no administration in time range)  lactated ringers bolus 1,000 mL (0 mLs Intravenous Stopped 05/29/22 1616)  morphine (PF) 4 MG/ML injection 4 mg (4 mg Intravenous Given 05/29/22 1456)  ondansetron (ZOFRAN) injection 4 mg (4 mg Intravenous Given 05/29/22 1457)  tranexamic acid (CYKLOKAPRON) IVPB 1,000 mg (0 mg Intravenous Stopped 05/29/22 1820)  lactated ringers bolus  1,000 mL (0 mLs Intravenous Stopped 05/29/22 2045)  ondansetron (ZOFRAN) injection 4 mg (4 mg Intravenous Given 05/29/22 1827)    ED Course/ Medical Decision Making/ A&P Clinical Course as of 05/29/22 2101  Sat May 29, 2022  1656 I spoke with Dr. Marvel Plan, patient's OB/GYN.  She is both the patient's primary outpatient OB/GYN and has known patient for many years along with the OB/GYN on-call tonight. She recommends IV txa, 1 g, if that helps her she can be discharged with  650 TID lysteda 5 days and stop the hormones.  She requested a repeat CBC.   [EH]  1944 Patient has been reevaluated multiple times. She finished her TXA about an hour ago, she did get up  to use the bedside commode and still had large clots.  She feels like she may be just needs to give the medicine more time to work. [EH]  2037 With patient's significant drop in her hemoglobin in the setting of active bleeding unchanged by TXA I had a discussion with patient regarding risks, benefits, and alternatives of blood transfusion. Patient gave informed consent for blood transfusion.  Both she and her husband signed consent on paper I left this at the bedside.  Patient's RN is aware. [EH]  2052 Patient continues to pass frequent large clots.  [EH]    Clinical Course User Index [EH] Lorin Glass, PA-C                           Medical Decision Making Patient is a 41 year old woman who presents today for evaluation of ongoing vaginal bleeding.  She has been seen by her OB/GYN for this, had an endometrial biopsy that she reports was reassuring however is changing a super tampon every 15 minutes per her report.  She states she is now feeling lightheaded, dizzy and very fatigued. Here patient intermittently tachycardic.  Her blood pressures are on the softer side dipping into the 90s.  Initial labs are obtained, 5 days ago her hemoglobin was 12.7, here her initial hemoglobin has dropped down to 10.3. She is given 1 L of IV  fluids and monitored. Pelvic ultrasound is obtained without large fibroid or a clear cause for patient's ongoing bleeding found. I spoke with Dr. Marvel Plan who is on-call for patient's OB/GYN group and happens to be patient's primary OB/GYN for 15+ years. She recommended giving IV TXA, an additional liter of fluids and rechecking a CBC.  Patient was reevaluated after TXA and continue to have heavy bleeding, orthostatic changes and high heart rate in addition to being symptomatic.  On repeat CBC her hemoglobin had dropped down to 7.6.  While I do suspect some of this is related to hemodilution, given that her bleeding has not slowed I discussed with patient, and her husband at bedside blood transfusion. Given the degree of her symptoms I recommended a blood transfusion. I discussed risks benefits, and alternatives with patient and her husband and 2 units of blood is ordered.  Dr. Marvel Plan with OB/GYN will place orders for obs over night.   Patient will require admission for ongoing blood loss.   The patient appears reasonably stabilized for admission considering the current resources, flow, and capabilities available in the ED at this time, and I doubt any other [facility] requiring further screening and/or treatment in the ED prior to admission pending timely bed placement.   Note: Portions of this report may have been transcribed using voice recognition software. Every effort was made to ensure accuracy; however, inadvertent computerized transcription errors may be present     Amount and/or Complexity of Data Reviewed Independent Historian: spouse External Data Reviewed: labs and notes. Labs: ordered. Radiology: ordered.  Risk Prescription drug management. Drug therapy requiring intensive monitoring for toxicity. Decision regarding hospitalization.  Critical Care Total time providing critical care: 75 minutes           Final Clinical Impression(s) / ED Diagnoses Final  diagnoses:  Vaginal bleeding  Anemia due to blood loss    Rx / DC Orders ED Discharge Orders     None         Ollen Gross 05/29/22 2103    Isla Pence, MD 05/30/22  1259  

## 2022-05-30 ENCOUNTER — Encounter (HOSPITAL_COMMUNITY): Payer: Self-pay | Admitting: Obstetrics and Gynecology

## 2022-05-30 LAB — CBC
HCT: 29.2 % — ABNORMAL LOW (ref 36.0–46.0)
HCT: 31.6 % — ABNORMAL LOW (ref 36.0–46.0)
Hemoglobin: 10.1 g/dL — ABNORMAL LOW (ref 12.0–15.0)
Hemoglobin: 10.6 g/dL — ABNORMAL LOW (ref 12.0–15.0)
MCH: 31.2 pg (ref 26.0–34.0)
MCH: 30.9 pg (ref 26.0–34.0)
MCHC: 34.6 g/dL (ref 30.0–36.0)
MCHC: 33.5 g/dL (ref 30.0–36.0)
MCV: 90.1 fL (ref 80.0–100.0)
MCV: 92.1 fL (ref 80.0–100.0)
Platelets: 222 10*3/uL (ref 150–400)
Platelets: 247 10*3/uL (ref 150–400)
RBC: 3.24 MIL/uL — ABNORMAL LOW (ref 3.87–5.11)
RBC: 3.43 MIL/uL — ABNORMAL LOW (ref 3.87–5.11)
RDW: 13.9 % (ref 11.5–15.5)
RDW: 14.2 % (ref 11.5–15.5)
WBC: 5 10*3/uL (ref 4.0–10.5)
WBC: 4.9 10*3/uL (ref 4.0–10.5)
nRBC: 0 % (ref 0.0–0.2)
nRBC: 0 % (ref 0.0–0.2)

## 2022-05-30 LAB — IRON AND TIBC
Iron: 115 ug/dL (ref 28–170)
Saturation Ratios: 28 % (ref 10.4–31.8)
TIBC: 407 ug/dL (ref 250–450)
UIBC: 292 ug/dL

## 2022-05-30 LAB — FERRITIN: Ferritin: 8 ng/mL — ABNORMAL LOW (ref 11–307)

## 2022-05-30 MED ORDER — SODIUM CHLORIDE 0.9 % IV SOLN
300.0000 mg | Freq: Once | INTRAVENOUS | Status: AC
  Administered 2022-05-30: 300 mg via INTRAVENOUS
  Filled 2022-05-30: qty 15

## 2022-05-30 MED ORDER — TRANEXAMIC ACID 650 MG PO TABS
1300.0000 mg | ORAL_TABLET | Freq: Three times a day (TID) | ORAL | Status: DC
  Administered 2022-05-30 – 2022-05-31 (×2): 1300 mg via ORAL
  Filled 2022-05-30 (×3): qty 2

## 2022-05-30 MED ORDER — IBUPROFEN 800 MG PO TABS
800.0000 mg | ORAL_TABLET | Freq: Three times a day (TID) | ORAL | Status: DC
  Administered 2022-05-30: 800 mg via ORAL
  Filled 2022-05-30: qty 1

## 2022-05-30 MED ORDER — IBUPROFEN 800 MG PO TABS
800.0000 mg | ORAL_TABLET | Freq: Three times a day (TID) | ORAL | Status: DC
  Administered 2022-05-30 – 2022-05-31 (×2): 800 mg via ORAL
  Filled 2022-05-30 (×3): qty 1

## 2022-05-30 NOTE — Plan of Care (Signed)

## 2022-05-30 NOTE — Progress Notes (Signed)
Pt reports that she did not bleed most of day.  Got up and walked the hall and did get a bit fatigued doing that. Not as dizzy.  She now is back in room and had increased cramping and passed a few clots. She thinks her total bleeding would have soaked about two pads for whole day.      Pt still with intermittent bleeding but seems to be improving.  She is feeling some fatigue and intermittent lightheadedness. She is concerned that her heavy bleeding will return and prefers to stay one more night and observe it before trying to go home which is reasonable since just started TXA late last night.   Will repeat CBC and iron studies this PM Encouraged patient to increase ambulation to see if tolerates Will adjust TXA schedule to 2300pm and 0700am as will be easier for her to do that at home.  If hgb stable and no further heavy bleeding, will plan d/c home tomorrow on TXA for 3 more days then stop for two days and then start OCP's. Planning for hysterectomy in near future.

## 2022-05-30 NOTE — Progress Notes (Signed)
Pt ambulated half a lap on unit. Had to take a break halfway during the walk due to fatigue. Got up and did much better after the little break. C/o spasms upon returning to the room. Medicated with the scheduled ibuprofen

## 2022-05-30 NOTE — Progress Notes (Signed)
Pt had an episode of bleeding/clots this morning. Also c/o spasms and feeling weaker but not as weak as she did last night. Monitoring to continue

## 2022-05-30 NOTE — Progress Notes (Signed)
Patient ID: Rose [name], female   DOB: Feb 21, 1981, 41 y.o.   MRN: [id number] Martin Majestic to check on Lavenia who is still in ED due to lack of beds.  Pt states feeling much better now that transfusion underway.  She can get up to bedside commode with assistance and though still a bit lightheaded, notes it is improved.  Afeb  VSS  Abdomen soft NT Vaginal bleeding currently minimal  Bleeding seems improved on TXA, about to receive second dose and will change to oral.  Finishing first unit of blood and since still a bit lightheaded will proceed with 2 units total and recheck Hgb s/p transfusion.  Hopefully bleeding will continue to be minimal and once transfusion completed if pt feeling better can be d/c home to complete 5 days of TXA total. Likely will then consider starting OCP's after a short break.  I d/w [place] Cerise I feel instead of planning for ablation we should consider a hysterectomy given the severity of her bleeding--she is agreeable. Goal will be to stabilize bleeding and schedule hysterectomy.   Will reevaluate post-transfusion for d/c.

## 2022-05-30 NOTE — ED Notes (Signed)
OBGYN finished rounding on pt, states she put in order for PO TXA and repeat Hg after 2U PRBC's complete. Will make sure we get a lab draw 2 hrs post-transfusions. Pharmacy was also called for TXA to be sent up, as it has not yet been received here.

## 2022-05-30 NOTE — Progress Notes (Signed)
Patient ID: Rose [name], female   DOB: 17-Nov-1980, 41 y.o.   MRN: [id number] Pt reports evening going well thus far. Has ambulated and though still a bit fatigued, is tolerating better.  Bleeding has been much less as well.  Repeat hgb good at 10.6 Iron studies returned and ferritin still quite low at 8. Pt cannot tolerate oral iron due to GI discomfort. D/w pharmacist and they agree a dose of venofer is acceptable while pt in-house given cannot tolerate po iron. Will get this PM  TXA and ibuprofen this PM at 2300 and in AM at 0700, she will need 3 more days worth sent in at d/c.  Plan will be to start COCP's 2 days after TXA and plan hysterectomy.   Pt in agreement with plan. d/c in AM as long as bleeding stable

## 2022-05-30 NOTE — ED Notes (Signed)
Pt moved to private room with bathroom attached, and switched out to hospital bed.

## 2022-05-30 NOTE — Progress Notes (Signed)
Patient ID: TORUNN CHANCELLOR, female   DOB: 07-Apr-1981, 41 y.o.   MRN: [id number] Pt had decreased bleeding for several hours overnight and then early this AM had a few gushes with partially saturating 3 pads.  Also noted some cramping. Currently is improved again.  She is able to ambulate to bathroom and no profound dizziness, but does still feel a bit weak.  She has not eaten much this AM.  Afeb  VSS  Abdomen soft Bleeding currently minimal on pad  Hgb s/p 2 units of PRBC's 10.1 '@0700'$   D/w pharmacy iron infusion and since got the blood they would hold off on that unless bleeding picks up again and then check ferritin stores to see where things are. TXA given now and changed ibuprofen to standing dose. D/w pt and husband since bleeding improved, and hgb improved, I feel reasonable to observe until afternoon and see if heavy bleeding resumes.  I want her to eat and see if feels less weak.  Pt agreeable to plan.

## 2022-05-31 MED ORDER — TRANEXAMIC ACID 650 MG PO TABS
1300.0000 mg | ORAL_TABLET | Freq: Three times a day (TID) | ORAL | 0 refills | Status: AC
Start: 2022-05-31 — End: 2022-06-03

## 2022-05-31 MED ORDER — IBUPROFEN 800 MG PO TABS: 800.0000 mg | ORAL_TABLET | Freq: Three times a day (TID) | ORAL | 0 refills | Status: DC

## 2022-05-31 NOTE — Discharge Summary (Signed)
Physician Discharge Summary  Patient ID: Rose [name] MRN: [id number] DOB/AGE: 11-19-1980 41 y.o.  Admit date: 05/29/2022 Discharge date: 05/31/2022  Admission Diagnoses:  Discharge Diagnoses:  Principal Problem:   Symptomatic anemia Active Problems:   Menorrhagia   Discharged Condition: good  Hospital Course: Pt admitted from ED where she presented  with heavy vaginal bleeding and symptomatic anemia.  She has a known issue with menorrhagia and is in process of workup with a negative EMB in office and [place] significant only for a very small intramural fibroid.(Less than 1 cm)   She was being treated with progesterone but began to bleed heavy again. In ED hgb dropped to 7.4 and she was unable to ambulate due to dizziness. Started pt on IV TXA and bleeding improved.  She was transfused 2 units of PRBC's and hgb improved to 10.1 post-transfusion. Received IV Venofer '300mg'$  x1 as well given inability to tolerate PO iron as outpatient 2/2 Gi issues. She gradually increased her ambulation and profound dizziness resolved. Discharge hgb is 10.6. Plan is to continue TXA for a total of 5 days, take 2 day break and then start ocp's to hold bleeding to minimum until can get hysterectomy scheduled.  Consults: None  Significant Diagnostic Studies: labs and pelvic US  Treatments: IV hydration, TXA,Blood transfusion 2 units PRBC's and venofer x 1  Discharge Exam: Blood pressure (!) 109/58, pulse 74, temperature 98.8 F (37.1 C), temperature source Oral, resp. rate 16, height '5\' 7"'$  (1.702 m), weight 54.4 kg, last menstrual period 05/29/2022, SpO2 100 %, unknown if currently breastfeeding. General appearance: alert and cooperative GI: soft, NT Pelvic: minimal bleeding  Disposition: Discharge disposition: 01-Home or Self Care        Allergies as of 05/31/2022       Reactions   Tape Dermatitis   Severe skin burn from adhesive tape   Codeine Nausea And Vomiting   Pt does not recall if she has  taken percocet or vicodin.  Discharge summary from last pregnancy says she was given prescription for ibuprofen and percocet.        Medication List     STOP taking these medications    progesterone 100 MG capsule Commonly known as: PROMETRIUM       TAKE these medications    ibuprofen 800 MG tablet Commonly known as: ADVIL Take 1 tablet (800 mg total) by mouth 3 (three) times daily. What changed:  medication strength how much to take when to take this reasons to take this   tranexamic acid 650 MG Tabs tablet Commonly known as: LYSTEDA Take 2 tablets (1,300 mg total) by mouth 3 (three) times daily for 8 doses.       Call physician if heavy vaginal bleeding soaking >2 heavy maxi pads/hr for >2hrs, dizziness, shortness of breath, palpitations, intractable abdominal pain/nausea or vomiting  Office will call for scheduled outpatient follow-up in regards to beginning birth control and planning for eventual hysterectomy   Signed: Deliah Boston 05/31/2022, 8:57 AM

## 2022-05-31 NOTE — Progress Notes (Signed)
S: Patient sitting up in bed. Eating breakfast (pancake and egg) without N/V. Notes scant dark red blood with urination but denies further passage of clots. Mild bloating tender feeling across lower abdomen but denies frank cramping. Ibuprofen helping with pain. Minimal fatigue while up OOB to bathroom. Aware of plan to complete TXA through Wednesday and begin COC afterwards.   O: BP (!) 109/58 (BP Location: Right Arm)   Pulse 74   Temp 98.8 F (37.1 C) (Oral)   Resp 16   Ht '5\' 7"'$  (1.702 m)   Wt 54.4 kg   LMP 05/29/2022 (Exact Date)   SpO2 100%   BMI 18.78 kg/m  Gen: sitting up in bed, no pallor, AAO, NAD CV: CTAB, RRR Abd: Well healed pfannenstiel. Soft, Bsx4. Minimal TTP (deep) suprapubically  GU: No bleeding on peripad this AM MSK: neg calf edema/TTP BL  A/P: This is a 41yo [id number] HD#3 admitted with symptomatic vaginal bleeding (failed Provera therapy, benign EMBx outpatient)  -S/p  2u pRBC, IV TXA q8hrs while in-patient, IV venofer '300mg'$ . -H/H improved from 7.6 on admission to 10.6. Cannot tolerate PO iron -DC how with TXA PO to cover through Thursday AM, pt requesting ibuprofen '800mg'$  as well  Already planning for CLOSE outpatient f/u and initiation of COC after TXA completion with eventual plans for preoperative stabilization for hysterectomy. All questions answered

## 2022-05-31 NOTE — Plan of Care (Signed)

## 2022-06-01 LAB — BPAM RBC
Blood Product Expiration Date: 202308242359
Blood Product Expiration Date: 202308242359
ISSUE DATE / TIME: 202307292106
ISSUE DATE / TIME: 202307300054
Unit Type and Rh: 6200
Unit Type and Rh: 6200

## 2022-06-01 LAB — TYPE AND SCREEN
ABO/RH(D): A POS
Antibody Screen: NEGATIVE
Unit division: 0
Unit division: 0

## 2022-06-16 ENCOUNTER — Encounter: Payer: Self-pay | Admitting: Nurse Practitioner

## 2022-06-16 ENCOUNTER — Telehealth: Payer: Self-pay | Admitting: Nurse Practitioner

## 2022-06-16 ENCOUNTER — Ambulatory Visit (INDEPENDENT_AMBULATORY_CARE_PROVIDER_SITE_OTHER): Payer: 59 | Admitting: Nurse Practitioner

## 2022-06-16 VITALS — BP 104/66 | HR 100 | Temp 96.6°F | Wt 119.6 lb

## 2022-06-16 DIAGNOSIS — S00521A Blister (nonthermal) of lip, initial encounter: Secondary | ICD-10-CM | POA: Diagnosis not present

## 2022-06-16 NOTE — Assessment & Plan Note (Signed)
She noticed a cluster of blisters on her right lower lip that started yesterday. She has been having increased stress. She states she was also having tingling and numbness on her right cheek and right ear. No other blisters seen on skin besides on her lip. 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 Her GYN sent her in a prescription for Valtrex twice a day until her surgery next week. Agree with this treatment. Reach out if she develops more blisters, especially if she gets any on the tip of her nose.

## 2022-06-16 NOTE — Telephone Encounter (Signed)
Caller Name: [name] Call back phone #: [phone]  Reason for Call: Pt called is after receiving a blood transfusion, she is not feeling the best. Showed frustration that she could not get in with [facility] and requested a nurse. I transferred her to Triage and she is also requesting a call from this office.

## 2022-06-16 NOTE — Progress Notes (Signed)
   Acute Office Visit  Subjective:     Patient ID: Rose [name], female    DOB: 1981-03-11, 41 y.o.   MRN: [id number]  Chief Complaint  Patient presents with   Blister    Blister on her bottom lip for 1 day    HPI Patient is in today for a cluster of blisters on her right lower lip. She states that she also had some numbness and tingling on her right cheek and her right ear. She has been under a lot of stress recently, was hospitalized for anemia due to menorrhagia, and is scheduled for a hysterectomy next week. 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 She had an old prescription for valtrex, and started this yesterday. She denies fevers.   ROS See pertinent positives and negatives per HPI.     Objective:    BP 104/66   Pulse 100   Temp (!) 96.6 F (35.9 C) (Temporal)   Wt 119 lb 9.6 oz (54.3 kg)   LMP 05/29/2022 (Exact Date)   SpO2 98%   BMI 18.73 kg/m    Physical Exam Vitals and nursing note reviewed.  Constitutional:      General: She is not in acute distress.    Appearance: Normal appearance.  HENT:     Head: Normocephalic.     Right Ear: Tympanic membrane, ear canal and external ear normal.  Eyes:     Conjunctiva/sclera: Conjunctivae normal.  Pulmonary:     Effort: Pulmonary effort is normal.  Musculoskeletal:     Cervical back: Normal range of motion.  Skin:    General: Skin is warm.     Findings: Lesion (cluster of blisters to right lower lip) present.  Neurological:     General: No focal deficit present.     Mental Status: She is alert and oriented to person, place, and time.  Psychiatric:        Mood and Affect: Mood normal.        Behavior: Behavior normal.        Thought Content: Thought content normal.        Judgment: Judgment normal.       Assessment & Plan:   Problem List Items Addressed This Visit       Digestive   Blister of lip - Primary    She noticed a cluster of  blisters on her right lower lip that started yesterday. She has been having increased stress. She states she was also having tingling and numbness on her right cheek and right ear. No other blisters seen on skin besides on her lip. 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 Her GYN sent her in a prescription for Valtrex twice a day until her surgery next week. Agree with this treatment. Reach out if she develops more blisters, especially if she gets any on the tip of her nose.        No orders of the defined types were placed in this encounter.   No follow-ups on file.  Charyl Dancer, NP

## 2022-06-16 NOTE — Patient Instructions (Signed)
It was great to see you!  Take Valtrex as prescribed by GYN. Please call our office if you develop any blisters on your nose.   Let's follow-up if your symptoms worsen or don't improve.   Take care,  Vance Peper, NP

## 2022-06-21 NOTE — Progress Notes (Signed)
Surgical Instructions    Your procedure is scheduled on Wednesday, August 23.  Report to [facility] Main Entrance "A" at 5:30 A.M., then check in with the Admitting office.  Call this number if you have problems the morning of surgery:  [phone]   If you have any questions prior to your surgery date call [phone]: Open Monday-Friday 8am-4pm    Remember:  Do not eat after midnight the night before your surgery  You may drink clear liquids until 4:30AM the morning of your surgery.   Clear liquids allowed are: Water, Non-Citrus Juices (without pulp), Carbonated Beverages, Clear Tea, Black Coffee ONLY (NO MILK, CREAM OR POWDERED CREAMER of any kind), and Gatorade    Take these medicines the morning of surgery with A SIP OF WATER: valacyclovir (valtrex)   As of today, STOP taking any Aspirin (unless otherwise instructed by your surgeon) Aleve, Naproxen, Ibuprofen, Motrin, Advil, Goody's, BC's, all herbal medications, fish oil, and all vitamins.      **Please be ready to provide urine sample upon [facility] any belongings or valuables.    Do NOT Smoke (Tobacco/Vaping)  24 hours prior to your procedure  If you use a CPAP at night, you may bring your mask for your overnight stay.   Contacts, glasses, hearing aids, dentures or partials may not be worn into surgery, please bring cases for these belongings   For patients admitted to the hospital, discharge time will be determined by your treatment team.   Patients discharged the day of surgery will not be allowed to drive home, and someone needs to stay with them for 24 hours.   SURGICAL WAITING ROOM VISITATION Patients having surgery or a procedure may have no more than 2 support people in the waiting area - these visitors may rotate.   Children under the age of 66 must have an adult with them who is not the patient. If the patient needs to stay at the hospital during part of  their recovery, the visitor guidelines for inpatient rooms apply. Pre-op nurse will coordinate an appropriate time for 1 support person to accompany patient in pre-op.  This support person may not rotate.   Please refer to the [facility] website for the visitor guidelines for Inpatients (after your surgery is over and you are in a regular room).    Special instructions:    Oral Hygiene is also important to reduce your risk of infection.  Remember - BRUSH YOUR TEETH THE MORNING OF SURGERY WITH YOUR REGULAR TOOTHPASTE   [place]- Preparing For Surgery  Before surgery, you can play an important role. Because skin is not sterile, your skin needs to be as free of germs as possible. You can reduce the number of germs on your skin by washing with CHG (chlorahexidine gluconate) Soap before surgery.  CHG is an antiseptic cleaner which kills germs and bonds with the skin to continue killing germs even after washing.     Please do not use if you have an allergy to CHG or antibacterial soaps. If your skin becomes reddened/irritated stop using the CHG.  Do not shave (including legs and underarms) for at least 48 hours prior to first CHG shower. It is OK to shave your face.  Please follow these instructions carefully.     Shower the NIGHT BEFORE SURGERY and the MORNING OF SURGERY with CHG Soap.   If you chose to wash your hair, wash your hair  first as usual with your normal shampoo. After you shampoo, rinse your hair and body thoroughly to remove the shampoo.  Then [employer] and genitals (private parts) with your normal soap and rinse thoroughly to remove soap.  After that Use CHG Soap as you would any other liquid soap. You can apply CHG directly to the skin and wash gently with a scrungie or a clean washcloth.   Apply the CHG Soap to your body ONLY FROM THE NECK DOWN.  Do not use on open wounds or open sores. Avoid contact with your eyes, ears, mouth and genitals (private parts). Wash Face and  genitals (private parts)  with your normal soap.   Wash thoroughly, paying special attention to the area where your surgery will be performed.  Thoroughly rinse your body with warm water from the neck down.  DO NOT shower/wash with your normal soap after using and rinsing off the CHG Soap.  Pat yourself dry with a CLEAN TOWEL.  Wear CLEAN PAJAMAS to bed the night before surgery  Place CLEAN SHEETS on your bed the night before your surgery  DO NOT SLEEP WITH PETS.   Day of Surgery:  Take a shower with CHG soap. Wear Clean/Comfortable clothing the morning of surgery Do not wear jewelry or makeup. Do not wear lotions, powders, perfumes/cologne or deodorant. Do not shave 48 hours prior to surgery.  Men may shave face and neck. Do not bring valuables to the hospital. Do not wear nail polish, gel polish, artificial nails, or any other type of covering on natural nails (fingers and toes) If you have artificial nails or gel coating that need to be removed by a nail salon, please have this removed prior to surgery. Artificial nails or gel coating may interfere with anesthesia's ability to adequately monitor your vital signs. Remember to brush your teeth WITH YOUR REGULAR TOOTHPASTE.    If you received a COVID test during your pre-op visit, it is requested that you wear a mask when out in public, stay away from anyone that may not be feeling well, and notify your surgeon if you develop symptoms. If you have been in contact with anyone that has tested positive in the last 10 days, please notify your surgeon.    Please read over the following fact sheets that you were given.

## 2022-06-22 ENCOUNTER — Other Ambulatory Visit: Payer: Self-pay

## 2022-06-22 ENCOUNTER — Encounter (HOSPITAL_COMMUNITY): Payer: Self-pay

## 2022-06-22 ENCOUNTER — Encounter (HOSPITAL_COMMUNITY)
Admission: RE | Admit: 2022-06-22 | Discharge: 2022-06-22 | Disposition: A | Discharge status: Home / Self Care | Payer: 59 | Source: Ambulatory Visit | Attending: Obstetrics and Gynecology | Admitting: Obstetrics and Gynecology

## 2022-06-22 VITALS — BP 112/53 | HR 85 | Temp 98.2°F | Resp 20 | Ht 67.0 in | Wt 120.3 lb

## 2022-06-22 DIAGNOSIS — N921 Excessive and frequent menstruation with irregular cycle: Secondary | ICD-10-CM | POA: Insufficient documentation

## 2022-06-22 DIAGNOSIS — Z01812 Encounter for preprocedural laboratory examination: Secondary | ICD-10-CM | POA: Diagnosis present

## 2022-06-22 DIAGNOSIS — D649 Anemia, unspecified: Secondary | ICD-10-CM | POA: Insufficient documentation

## 2022-06-22 DIAGNOSIS — Z01818 Encounter for other preprocedural examination: Secondary | ICD-10-CM

## 2022-06-22 LAB — CBC
HCT: 36.1 % (ref 36.0–46.0)
Hemoglobin: 11.7 g/dL — ABNORMAL LOW (ref 12.0–15.0)
MCH: 30.8 pg (ref 26.0–34.0)
MCHC: 32.4 g/dL (ref 30.0–36.0)
MCV: 95 fL (ref 80.0–100.0)
Platelets: 303 10*3/uL (ref 150–400)
RBC: 3.8 MIL/uL — ABNORMAL LOW (ref 3.87–5.11)
RDW: 12.9 % (ref 11.5–15.5)
WBC: 4.8 10*3/uL (ref 4.0–10.5)
nRBC: 0 % (ref 0.0–0.2)

## 2022-06-22 LAB — TYPE AND SCREEN
ABO/RH(D): A POS
Antibody Screen: NEGATIVE

## 2022-06-22 NOTE — Progress Notes (Signed)
error 

## 2022-06-22 NOTE — Progress Notes (Signed)
PCP - Vance Peper, NP Cardiologist - Dr. Collier Salina   PPM/ICD - denies   Chest x-ray - 05/24/22 EKG - 05/29/22 Stress Test - denies ECHO - 08/22/18 Cardiac Cath - denies  Sleep Study - denies   DM- denies  ASA/Blood Thinner Instructions: n/a   ERAS Protcol - yes, no drink   COVID TEST- n/a   Anesthesia review: no  Patient denies shortness of breath, fever, cough and chest pain at PAT appointment   All instructions explained to the patient, with a verbal understanding of the material. Patient agrees to go over the instructions while at home for a better understanding.  The opportunity to ask questions was provided.

## 2022-06-22 NOTE — H&P (Signed)
Rose [name] is an 41 y.o. female [id number] who presents for scheduled laparoscopic hysterectomy and bilateral salpingectomies.  She was admitted to hospital 05/29/22 with severe symptomatic anemia from menorrhagia requiring transfusion.  She repors that heavier menses started about 6 months ago, but had been getting progressively worse and longer.   Husband with vasectomy, and done with childbearing.  EMB performed in the office returned benign proliferative endometrium.  [place] without any noted definitive pathology. She would like definitive surgical treatment.   OB History:  03-01-2009  SAB  04-03-2010, 39 wks 1. F, 6lbs 15oz, NSVD  01-29-2013, 39.3 wks  LTCS  Menstrual History:  Patient's last menstrual period was 05/29/2022 (exact date).    Past Medical History:  Diagnosis Date   Degenerative disc disease, lumbar    Palpitations    Subchorionic hemorrhage 07/17/2012    Past Surgical History:  Procedure Laterality Date   BACK SURGERY     x 2   BREAST SURGERY     CESAREAN SECTION N/A 01/29/2013   Procedure: Primary cesarean section with delivery of baby ;  Surgeon: Logan Bores, MD;  Location: [facility];  Service: Obstetrics;  Laterality: N/A;  Primary   DILATION AND CURETTAGE OF UTERUS     x2   TONSILLECTOMY      Family History  Problem Relation Age of Onset   Thyroid disease Mother    Heart disease Father    Cancer Father        throat   Other Daughter        Rett syndrome    Cancer Maternal Grandmother        breast    Social History:  reports that she has never smoked. She has never used smokeless tobacco. She reports current alcohol use. She reports that she does not use drugs.  Allergies:  Allergies  Allergen Reactions   Tape Dermatitis    Severe skin burn from adhesive tape   Codeine Nausea And Vomiting    Pt does not recall if she has taken percocet or vicodin.  Discharge summary from last pregnancy says she was given prescription for ibuprofen and  percocet.    No medications prior to admission.    Review of Systems  Constitutional:  Negative for fever.  Gastrointestinal:  Negative for abdominal pain.  Genitourinary:  Positive for menstrual problem. Negative for vaginal bleeding.    Last menstrual period 05/29/2022, unknown if currently breastfeeding. Physical Exam Constitutional:      Appearance: Normal appearance.  Cardiovascular:     Rate and Rhythm: Normal rate and regular rhythm.  Pulmonary:     Effort: Pulmonary effort is normal.     Breath sounds: Normal breath sounds.  Abdominal:     Palpations: Abdomen is soft.  Genitourinary:    General: Normal vulva.     Comments: Uterus ULN size, fair descensus Neurological:     General: No focal deficit present.     Mental Status: She is alert.     Results for orders placed or performed during the hospital encounter of 06/22/22 (from the past 24 hour(s))  Type and screen     Status: None   Collection Time: 06/22/22 11:06 AM  Result Value Ref Range   ABO/RH(D) A POS    Antibody Screen NEG    Sample Expiration      06/25/2022,2359 Performed at [facility] [address] [place], [place] [address]   CBC     Status: Abnormal  Collection Time: 06/22/22 11:25 AM  Result Value Ref Range   WBC 4.8 4.0 - 10.5 K/uL   RBC 3.80 (L) 3.87 - 5.11 MIL/uL   Hemoglobin 11.7 (L) 12.0 - 15.0 g/dL   HCT 36.1 36.0 - 46.0 %   MCV 95.0 80.0 - 100.0 fL   MCH 30.8 26.0 - 34.0 pg   MCHC 32.4 30.0 - 36.0 g/dL   RDW 12.9 11.5 - 15.5 %   Platelets 303 150 - 400 K/uL   nRBC 0.0 0.0 - 0.2 %    No results found.  Assessment/Plan: Pt was counseled on the risks and benefits of TLH/BS including bleeding, infection, and possible damage to bowel and bladder or ureters.  She would accept a transfusion if needed.  We reviewed the procedure in detail including laparoscopic vs vaginal closure of cuff.  We discussed a possible incision in the event of any complication and possible  delayed recovery from that. She would like to retain her ovaries unless abnormal. She is ready to proceed.  Logan Bores 06/22/2022, 8:20 PM

## 2022-06-23 ENCOUNTER — Encounter (HOSPITAL_COMMUNITY): Payer: Self-pay | Admitting: Obstetrics and Gynecology

## 2022-06-23 ENCOUNTER — Encounter (HOSPITAL_COMMUNITY)
Admission: RE | Disposition: A | Discharge status: Home / Self Care | Payer: Self-pay | Source: Home / Self Care | Attending: Obstetrics and Gynecology

## 2022-06-23 ENCOUNTER — Other Ambulatory Visit: Payer: Self-pay

## 2022-06-23 ENCOUNTER — Ambulatory Visit (HOSPITAL_BASED_OUTPATIENT_CLINIC_OR_DEPARTMENT_OTHER): Payer: 59 | Admitting: Certified Registered"

## 2022-06-23 ENCOUNTER — Ambulatory Visit (HOSPITAL_COMMUNITY)
Admission: RE | Admit: 2022-06-23 | Discharge: 2022-06-24 | Disposition: A | Discharge status: Home / Self Care | Payer: 59 | Attending: Obstetrics and Gynecology | Admitting: Obstetrics and Gynecology

## 2022-06-23 ENCOUNTER — Ambulatory Visit (HOSPITAL_COMMUNITY): Payer: 59 | Admitting: Certified Registered"

## 2022-06-23 DIAGNOSIS — N920 Excessive and frequent menstruation with regular cycle: Secondary | ICD-10-CM

## 2022-06-23 DIAGNOSIS — D251 Intramural leiomyoma of uterus: Secondary | ICD-10-CM | POA: Insufficient documentation

## 2022-06-23 DIAGNOSIS — Z90710 Acquired absence of both cervix and uterus: Secondary | ICD-10-CM

## 2022-06-23 DIAGNOSIS — Z01818 Encounter for other preprocedural examination: Secondary | ICD-10-CM

## 2022-06-23 DIAGNOSIS — D649 Anemia, unspecified: Secondary | ICD-10-CM | POA: Diagnosis not present

## 2022-06-23 DIAGNOSIS — N921 Excessive and frequent menstruation with irregular cycle: Secondary | ICD-10-CM

## 2022-06-23 DIAGNOSIS — M1990 Unspecified osteoarthritis, unspecified site: Secondary | ICD-10-CM | POA: Insufficient documentation

## 2022-06-23 DIAGNOSIS — D500 Iron deficiency anemia secondary to blood loss (chronic): Secondary | ICD-10-CM

## 2022-06-23 HISTORY — PX: TOTAL LAPAROSCOPIC HYSTERECTOMY WITH SALPINGECTOMY: SHX6742

## 2022-06-23 HISTORY — PX: CYSTOSCOPY: SHX5120

## 2022-06-23 LAB — POCT PREGNANCY, URINE: Preg Test, Ur: NEGATIVE

## 2022-06-23 SURGERY — HYSTERECTOMY, TOTAL, LAPAROSCOPIC, WITH SALPINGECTOMY
Anesthesia: General | Laterality: Bilateral

## 2022-06-23 MED ORDER — SCOPOLAMINE 1 MG/3DAYS TD PT72
MEDICATED_PATCH | TRANSDERMAL | Status: AC
  Administered 2022-06-23: 1.5 mg via TRANSDERMAL
  Filled 2022-06-23: qty 1

## 2022-06-23 MED ORDER — ACETAMINOPHEN 500 MG PO TABS
1000.0000 mg | ORAL_TABLET | Freq: Four times a day (QID) | ORAL | Status: DC
  Administered 2022-06-23 – 2022-06-24 (×4): 1000 mg via ORAL
  Filled 2022-06-23 (×4): qty 2

## 2022-06-23 MED ORDER — ACETAMINOPHEN 10 MG/ML IV SOLN
1000.0000 mg | Freq: Once | INTRAVENOUS | Status: DC | PRN
Start: 2022-06-23 — End: 2022-06-23

## 2022-06-23 MED ORDER — AMISULPRIDE (ANTIEMETIC) 5 MG/2ML IV SOLN
INTRAVENOUS | Status: AC
  Filled 2022-06-23: qty 4

## 2022-06-23 MED ORDER — PROPOFOL 10 MG/ML IV BOLUS
INTRAVENOUS | Status: AC
  Filled 2022-06-23: qty 20

## 2022-06-23 MED ORDER — SUGAMMADEX SODIUM 200 MG/2ML IV SOLN
INTRAVENOUS | Status: DC | PRN
  Administered 2022-06-23: 200 mg via INTRAVENOUS

## 2022-06-23 MED ORDER — PROMETHAZINE HCL 25 MG/ML IJ SOLN: 6.2500 mg | INTRAMUSCULAR | Status: DC | PRN

## 2022-06-23 MED ORDER — HYDROMORPHONE HCL 1 MG/ML IJ SOLN
1.0000 mg | INTRAMUSCULAR | Status: DC | PRN
  Administered 2022-06-23 (×2): 1 mg via INTRAVENOUS
  Filled 2022-06-23 (×2): qty 1

## 2022-06-23 MED ORDER — MAGNESIUM HYDROXIDE 400 MG/5ML PO SUSP: 30.0000 mL | Freq: Every day | ORAL | Status: DC | PRN

## 2022-06-23 MED ORDER — LIDOCAINE 2% (20 MG/ML) 5 ML SYRINGE
INTRAMUSCULAR | Status: DC | PRN
  Administered 2022-06-23: 100 mg via INTRAVENOUS

## 2022-06-23 MED ORDER — BUPIVACAINE HCL (PF) 0.25 % IJ SOLN
INTRAMUSCULAR | Status: DC | PRN
  Administered 2022-06-23: 15 mL

## 2022-06-23 MED ORDER — MIDAZOLAM HCL 2 MG/2ML IJ SOLN
INTRAMUSCULAR | Status: DC | PRN
  Administered 2022-06-23: 2 mg via INTRAVENOUS

## 2022-06-23 MED ORDER — FENTANYL CITRATE (PF) 100 MCG/2ML IJ SOLN
25.0000 ug | INTRAMUSCULAR | Status: DC | PRN
  Administered 2022-06-23: 25 ug via INTRAVENOUS

## 2022-06-23 MED ORDER — CEFAZOLIN SODIUM-DEXTROSE 2-4 GM/100ML-% IV SOLN
2.0000 g | INTRAVENOUS | Status: AC
  Administered 2022-06-23: 2 g via INTRAVENOUS

## 2022-06-23 MED ORDER — VALACYCLOVIR HCL 500 MG PO TABS
1000.0000 mg | ORAL_TABLET | Freq: Every day | ORAL | Status: DC
  Filled 2022-06-23 (×2): qty 2

## 2022-06-23 MED ORDER — AMISULPRIDE (ANTIEMETIC) 5 MG/2ML IV SOLN
10.0000 mg | Freq: Once | INTRAVENOUS | Status: AC | PRN
  Administered 2022-06-23: 10 mg via INTRAVENOUS

## 2022-06-23 MED ORDER — BUPIVACAINE HCL (PF) 0.25 % IJ SOLN
INTRAMUSCULAR | Status: AC
  Filled 2022-06-23: qty 30

## 2022-06-23 MED ORDER — ONDANSETRON HCL 4 MG/2ML IJ SOLN
4.0000 mg | Freq: Four times a day (QID) | INTRAMUSCULAR | Status: DC | PRN
  Administered 2022-06-23 (×2): 4 mg via INTRAVENOUS
  Filled 2022-06-23 (×2): qty 2

## 2022-06-23 MED ORDER — OXYCODONE HCL 5 MG PO TABS
5.0000 mg | ORAL_TABLET | ORAL | Status: DC | PRN
  Administered 2022-06-23 – 2022-06-24 (×3): 10 mg via ORAL
  Filled 2022-06-23 (×4): qty 2

## 2022-06-23 MED ORDER — SIMETHICONE 80 MG PO CHEW: 80.0000 mg | CHEWABLE_TABLET | Freq: Four times a day (QID) | ORAL | Status: DC | PRN

## 2022-06-23 MED ORDER — OXYCODONE HCL 5 MG/5ML PO SOLN: 5.0000 mg | Freq: Once | ORAL | Status: DC | PRN

## 2022-06-23 MED ORDER — OXYCODONE HCL 5 MG PO TABS: 5.0000 mg | ORAL_TABLET | Freq: Once | ORAL | Status: DC | PRN

## 2022-06-23 MED ORDER — ONDANSETRON HCL 4 MG PO TABS: 4.0000 mg | ORAL_TABLET | Freq: Four times a day (QID) | ORAL | Status: DC | PRN

## 2022-06-23 MED ORDER — POVIDONE-IODINE 10 % EX SWAB: 2.0000 | Freq: Once | CUTANEOUS | Status: DC

## 2022-06-23 MED ORDER — LACTATED RINGERS IV SOLN: INTRAVENOUS | Status: DC

## 2022-06-23 MED ORDER — ROCURONIUM 10MG/ML (10ML) SYRINGE FOR MEDFUSION PUMP - OPTIME
INTRAVENOUS | Status: DC | PRN
  Administered 2022-06-23: 100 mg via INTRAVENOUS

## 2022-06-23 MED ORDER — ACETAMINOPHEN 160 MG/5ML PO SOLN: 325.0000 mg | ORAL | Status: DC | PRN

## 2022-06-23 MED ORDER — MENTHOL 3 MG MT LOZG: 1.0000 | LOZENGE | OROMUCOSAL | Status: DC | PRN

## 2022-06-23 MED ORDER — CEFAZOLIN SODIUM-DEXTROSE 2-4 GM/100ML-% IV SOLN
INTRAVENOUS | Status: AC
  Filled 2022-06-23: qty 100

## 2022-06-23 MED ORDER — SUFENTANIL CITRATE 50 MCG/ML IV SOLN
INTRAVENOUS | Status: AC
  Filled 2022-06-23: qty 1

## 2022-06-23 MED ORDER — PROPOFOL 10 MG/ML IV BOLUS
INTRAVENOUS | Status: DC | PRN
  Administered 2022-06-23: 120 mg via INTRAVENOUS

## 2022-06-23 MED ORDER — FENTANYL CITRATE (PF) 100 MCG/2ML IJ SOLN
INTRAMUSCULAR | Status: AC
  Filled 2022-06-23: qty 2

## 2022-06-23 MED ORDER — SCOPOLAMINE 1 MG/3DAYS TD PT72: 1.0000 | MEDICATED_PATCH | TRANSDERMAL | Status: DC

## 2022-06-23 MED ORDER — CHLORHEXIDINE GLUCONATE 0.12 % MT SOLN
OROMUCOSAL | Status: AC
  Administered 2022-06-23: 15 mL via OROMUCOSAL
  Filled 2022-06-23: qty 15

## 2022-06-23 MED ORDER — ACETAMINOPHEN 325 MG PO TABS: 325.0000 mg | ORAL_TABLET | ORAL | Status: DC | PRN

## 2022-06-23 MED ORDER — DEXAMETHASONE SODIUM PHOSPHATE 10 MG/ML IJ SOLN
INTRAMUSCULAR | Status: DC | PRN
  Administered 2022-06-23: 10 mg via INTRAVENOUS

## 2022-06-23 MED ORDER — LACTATED RINGERS IV SOLN
INTRAVENOUS | Status: DC
Start: 2022-06-23 — End: 2022-06-23

## 2022-06-23 MED ORDER — LACTATED RINGERS IV SOLN: INTRAVENOUS | Status: DC | PRN

## 2022-06-23 MED ORDER — SODIUM CHLORIDE 0.9 % IR SOLN
Status: DC | PRN
  Administered 2022-06-23: 1

## 2022-06-23 MED ORDER — MIDAZOLAM HCL 2 MG/2ML IJ SOLN
INTRAMUSCULAR | Status: AC
  Filled 2022-06-23: qty 2

## 2022-06-23 MED ORDER — CHLORHEXIDINE GLUCONATE 0.12 % MT SOLN
15.0000 mL | OROMUCOSAL | Status: AC
  Filled 2022-06-23: qty 15

## 2022-06-23 MED ORDER — SUFENTANIL CITRATE 50 MCG/ML IV SOLN
INTRAVENOUS | Status: DC | PRN
  Administered 2022-06-23: 10 ug via INTRAVENOUS
  Administered 2022-06-23: 20 ug via INTRAVENOUS

## 2022-06-23 MED ORDER — ONDANSETRON HCL 4 MG/2ML IJ SOLN
INTRAMUSCULAR | Status: DC | PRN
  Administered 2022-06-23: 4 mg via INTRAVENOUS

## 2022-06-23 MED ORDER — IBUPROFEN 600 MG PO TABS
600.0000 mg | ORAL_TABLET | Freq: Four times a day (QID) | ORAL | Status: DC
  Administered 2022-06-23 – 2022-06-24 (×4): 600 mg via ORAL
  Filled 2022-06-23 (×4): qty 1

## 2022-06-23 SURGICAL SUPPLY — 43 items
ADH SKN CLS LQ APL DERMABOND (GAUZE/BANDAGES/DRESSINGS) ×2
DERMABOND ADHESIVE PROPEN (GAUZE/BANDAGES/DRESSINGS) ×2
DERMABOND ADVANCED .7 DNX6 (GAUZE/BANDAGES/DRESSINGS) IMPLANT
DEVICE SUTURE ENDOST 10MM (ENDOMECHANICALS) IMPLANT
DURAPREP 26ML APPLICATOR (WOUND CARE) ×2 IMPLANT
FILTER SMOKE EVAC LAPAROSHD (FILTER) ×2 IMPLANT
GLOVE BIO SURGEON STRL SZ 6.5 (GLOVE) ×4 IMPLANT
GLOVE SURG UNDER POLY LF SZ7 (GLOVE) ×4 IMPLANT
GOWN STRL REUS W/ TWL LRG LVL3 (GOWN DISPOSABLE) ×6 IMPLANT
GOWN STRL REUS W/TWL LRG LVL3 (GOWN DISPOSABLE) ×8
HIBICLENS CHG 4% 4OZ BTL (MISCELLANEOUS) ×2 IMPLANT
IRRIG SUCT STRYKERFLOW 2 WTIP (MISCELLANEOUS) ×2
IRRIGATION SUCT STRKRFLW 2 WTP (MISCELLANEOUS) ×2 IMPLANT
KIT TURNOVER KIT B (KITS) ×2 IMPLANT
NDL INSUFFLATION 14GA 120MM (NEEDLE) ×2 IMPLANT
NEEDLE INSUFFLATION 14GA 120MM (NEEDLE) ×2 IMPLANT
NS IRRIG 1000ML POUR BTL (IV SOLUTION) ×2 IMPLANT
OCCLUDER COLPOPNEUMO (BALLOONS) IMPLANT
PACK LAPAROSCOPY BASIN (CUSTOM PROCEDURE TRAY) ×2 IMPLANT
PACK TRENDGUARD 450 HYBRID PRO (MISCELLANEOUS) IMPLANT
PORT ACCESS TROCAR AIRSEAL 5 (TROCAR) IMPLANT
PROTECTOR NERVE ULNAR (MISCELLANEOUS) ×4 IMPLANT
SET CYSTO W/LG BORE CLAMP LF (SET/KITS/TRAYS/PACK) IMPLANT
SET TRI-LUMEN FLTR TB AIRSEAL (TUBING) IMPLANT
SHEARS 1100 HARMONIC 36 (ELECTROSURGICAL) IMPLANT
SLEEVE ENDOPATH XCEL 5M (ENDOMECHANICALS) ×2 IMPLANT
SUT ENDO VLOC 180-0-8IN (SUTURE) IMPLANT
SUT VIC AB 0 CT1 27 (SUTURE) ×4
SUT VIC AB 0 CT1 27XBRD ANBCTR (SUTURE) ×4 IMPLANT
SUT VIC AB 3-0 PS2 18 (SUTURE) ×2
SUT VIC AB 3-0 PS2 18XBRD (SUTURE) ×2 IMPLANT
SUT VICRYL 0 UR6 27IN ABS (SUTURE) ×2 IMPLANT
SYR 10ML LL (SYRINGE) ×2 IMPLANT
SYR 50ML LL SCALE MARK (SYRINGE) ×2 IMPLANT
SYSTEM CARTER THOMASON II (TROCAR) IMPLANT
TIP UTERINE 6.7X8CM BLUE DISP (MISCELLANEOUS) IMPLANT
TOWEL GREEN STERILE FF (TOWEL DISPOSABLE) ×6 IMPLANT
TRAY FOLEY W/BAG SLVR 14FR (SET/KITS/TRAYS/PACK) ×2 IMPLANT
TRENDGUARD 450 HYBRID PRO PACK (MISCELLANEOUS) ×2
TROCAR XCEL NON-BLD 11X100MML (ENDOMECHANICALS) ×2 IMPLANT
TROCAR XCEL NON-BLD 5MMX100MML (ENDOMECHANICALS) ×2 IMPLANT
UNDERPAD 30X36 HEAVY ABSORB (UNDERPADS AND DIAPERS) ×2 IMPLANT
WARMER LAPAROSCOPE (MISCELLANEOUS) ×2 IMPLANT

## 2022-06-23 NOTE — Progress Notes (Signed)
Patient ID: Rose [name], female   DOB: 03/26/1981, 41 y.o.   MRN: [id number] Pt RN contacted me at request of husband due to pain.  Pain had been doing ok but c/o foley catheter bothering her.  RN tried to get her up to see if could walk so they could remove it.  Pt then experienced pain in her left side and quickly escalated to a 10.  VSS, UOP 243m in bag.  No nausea.   Advised RN to medicate with IV dilaudid 1 mg and pull foley cath when patient more comfortable.  Pt will need to be able to ambulate to void.

## 2022-06-23 NOTE — Anesthesia Procedure Notes (Signed)
Procedure Name: Intubation Date/Time: 06/23/2022 7:44 AM  Performed by: Claris Che, CRNAPre-anesthesia Checklist: Patient identified, Emergency Drugs available, Suction available, Patient being monitored and Timeout performed Patient Re-evaluated:Patient Re-evaluated prior to induction Oxygen Delivery Method: Circle system utilized Preoxygenation: Pre-oxygenation with 100% oxygen Induction Type: IV induction and Cricoid Pressure applied Ventilation: Mask ventilation without difficulty Laryngoscope Size: Mac and 4 Grade View: Grade III Tube type: Oral Tube size: 7.5 mm Number of attempts: 2 Airway Equipment and Method: Stylet Placement Confirmation: ETT inserted through vocal cords under direct vision, positive ETCO2 and breath sounds checked- equal and bilateral Secured at: 22 cm Tube secured with: Tape Dental Injury: Teeth and Oropharynx as per pre-operative assessment

## 2022-06-23 NOTE — Transfer of Care (Signed)
Immediate Anesthesia Transfer of Care Note  Patient: Rose [name]  Procedure(s) Performed: TOTAL LAPAROSCOPIC HYSTERECTOMY WITH BILATERAL SALPINGECTOMY (Bilateral) CYSTOSCOPY  Patient Location: PACU  Anesthesia Type:General  Level of Consciousness: oriented, drowsy, patient cooperative and responds to stimulation  Airway & Oxygen Therapy: Patient Spontanous Breathing and Patient connected to nasal cannula oxygen  Post-op Assessment: Report given to RN, Post -op Vital signs reviewed and stable and Patient moving all extremities X 4  Post vital signs: Reviewed and stable  Last Vitals:  Vitals Value Taken Time  BP 107/65 06/23/22 0945  Temp    Pulse 51 06/23/22 0949  Resp 13 06/23/22 0949  SpO2 100 % 06/23/22 0949  Vitals shown include unvalidated device data.  Last Pain:  Vitals:   06/23/22 0611  PainSc: 0-No pain      Patients Stated Pain Goal: 0 (62/69/48 5462)  Complications: No notable events documented.

## 2022-06-23 NOTE — Anesthesia Preprocedure Evaluation (Addendum)
Anesthesia Evaluation  Patient identified by MRN, date of birth, ID band Patient awake    Reviewed: Allergy & Precautions, NPO status , Patient's Chart, lab work & pertinent test results  Airway Mallampati: I   Neck ROM: Full  Mouth opening: Limited Mouth Opening  Dental  (+) Teeth Intact, Dental Advisory Given   Pulmonary neg pulmonary ROS,    breath sounds clear to auscultation       Cardiovascular negative cardio ROS   Rhythm:Regular Rate:Normal     Neuro/Psych negative neurological ROS  negative psych ROS   GI/Hepatic negative GI ROS, Neg liver ROS,   Endo/Other  negative endocrine ROS  Renal/GU negative Renal ROS     Musculoskeletal  (+) Arthritis ,   Abdominal Normal abdominal exam  (+)   Peds  Hematology negative hematology ROS (+)   Anesthesia Other Findings   Reproductive/Obstetrics                            Anesthesia Physical Anesthesia Plan  ASA: 1  Anesthesia Plan: General   Post-op Pain Management:    Induction: Intravenous  PONV Risk Score and Plan: 4 or greater and Ondansetron, Dexamethasone, Midazolam and Scopolamine patch - Pre-op  Airway Management Planned: Oral ETT  Additional Equipment: None  Intra-op Plan:   Post-operative Plan: Extubation in OR  Informed Consent: I have reviewed the patients History and Physical, chart, labs and discussed the procedure including the risks, benefits and alternatives for the proposed anesthesia with the patient or authorized representative who has indicated his/her understanding and acceptance.     Dental advisory given  Plan Discussed with: CRNA  Anesthesia Plan Comments:        Anesthesia Quick Evaluation

## 2022-06-23 NOTE — Progress Notes (Signed)
Sacral foam not placed on patient prior to today's procedure due to allergy to adhesive. Patient states the last time she had one placed it caused second degree burns to skin.

## 2022-06-23 NOTE — Interval H&P Note (Signed)
History and Physical Interval Note:  06/23/2022 7:10 AM  Rose [name]  has presented today for surgery, with the diagnosis of excessive menstration with cycle.  The various methods of treatment have been discussed with the patient and family. After consideration of risks, benefits and other options for treatment, the patient has consented to  Procedure(s): TOTAL LAPAROSCOPIC HYSTERECTOMY WITH SALPINGECTOMY (Bilateral) as a surgical intervention.  The patient's history has been reviewed, patient examined, no change in status, stable for surgery.  I have reviewed the patient's chart and labs.  Questions were answered to the patient's satisfaction.     Logan Bores

## 2022-06-23 NOTE — Plan of Care (Signed)
?  Problem: Education: ?Goal: Knowledge of the prescribed therapeutic regimen will improve ?Outcome: Progressing ?  ?Problem: Education: ?Goal: Knowledge of General Education information will improve ?Description: Including pain rating scale, medication(s)/side effects and non-pharmacologic comfort measures ?Outcome: Progressing ?  ?Problem: Health Behavior/Discharge Planning: ?Goal: Ability to manage health-related needs will improve ?Outcome: Progressing ?  ?

## 2022-06-23 NOTE — Progress Notes (Signed)
DOS    Pt feeling much better after episode of pain when tried to get out of bed earlier.  Resting comfortably.  Has had po clears and small amount of po with no emesis and mild nausea resolved.   Afeb VSS  Abdomen soft NT Incisions well-approximated, left incision palpated and feels intact UOP good, about 327m in bag, clear urine  Pt tolerating po intake and meds Plan is to try ambulation again this PM or early AM and then pull foley if tolerates well Plan d/c tomorrow

## 2022-06-23 NOTE — Op Note (Signed)
Operative Note  **Dr. Londell Moh assistance was required for retraction and instrumentation to complete the case  Preoperative Diagnosis Menorrhagia causing symptomatic anemia requiring transfusion  Postoperative Diagnosis same  Procedure Total laparoscopic hysterectomy with bilateral salpingectomies Cystoscopy  Surgeon Paula Compton, MD Eula Flax, MD  Anesthesia GETA  Fluids: EBL 13m UOP 3572mclear urine IVF 170075mR  Findings Uterus was globular in appearance and ULN in size.  Tubes and ovaries WNL.  Liver appeared normal as did the remaining abdominal and pelvic anatomy  Specimen Uterus, cervix, and tubes  Procedure Note  Patient was taken to the operating room where general anesthesia was obtained without difficulty she was prepped and draped in the normal sterile fashion in the dorsal lithotomy position. An appropriate time out was performed. A Rumi with cervical cup was placed after dilation.The vaginal occluder was placed but not filled and a Foley catheter placed in the bladder. Attention was then turned to the abdomen where of infraumbilical incision was made after injection with quarter percent Marcaine approximate 1 cm in length.  The verees needle was introduced and intraperitoneal placement confirmed was aspiration and injection with normal saline.  Gas flow was applied and pneumoperitoneum obtained. The optiview 5mm73mocar was then inserted under direct visualization.   Two additional  ports were placed under direct visualization from the lateral upper quadrants. A 5mm 25m seal port was placed on the right and an eleven mm port on the left.   Each port site was injected with quarter percent Marcaine prior to placement.   With patient in Trendelenburg the uterus and tubes and ovaries were inspected with findings as previously stated. The Harmonic scalpel was then utilized to dissect the fallopian tubes from the mesosalpinx bilaterally down to the level of the  cornua.  The remainder of the uteroovarian ligament and the round ligament were then also taken down with the Harmonic to the level of the bladder flap.  The bladder flap was taken down from the lower uterine segment and pushed away to expose the cervix.   The uterine arteries were then taken down bilaterally with no difficulty and the vaginal cuff identified and opened over the vaginal ring.  The cuff was then completely opened over the ring until the uterus was completely free.  It was then pulled down into the vagina.  The vaginal cuff was then closed with a running v-lock suture with good closure.The cuff and pedicles were hemostatic. A four quadrant view of the pelvis and abdomen was performed and found to be normal with no bleeding or injuries noted.  The Carter-Thomasson device was used to close the left 11 mm port fascia with 0-vicryl under direct visualization.   The instruments were removed from the abdomen as well as the  ports under visualization.  The skin at the port sites was closed with 3-0 vicryl.   Dermabond was placed over the incisions. Attention was turned vaginally where the specimen was removed from the vagina and handed off.  The foley was then removed and the 70 degree cystoscope introduced into the bladder.  There were no sutures or injuries and the ureteral jets were seen bilaterally and were normal.  All instruments were then removed.  Patient was then awakened and taken to the recovery room in good condition.

## 2022-06-23 NOTE — Anesthesia Postprocedure Evaluation (Signed)
Anesthesia Post Note  Patient: Rose [name]  Procedure(s) Performed: TOTAL LAPAROSCOPIC HYSTERECTOMY WITH BILATERAL SALPINGECTOMY (Bilateral) CYSTOSCOPY     Patient location during evaluation: PACU Anesthesia Type: General Level of consciousness: awake and alert Pain management: pain level controlled Vital Signs Assessment: post-procedure vital signs reviewed and stable Respiratory status: spontaneous breathing, nonlabored ventilation, respiratory function stable and patient connected to nasal cannula oxygen Cardiovascular status: blood pressure returned to baseline and stable Postop Assessment: no apparent nausea or vomiting Anesthetic complications: no   No notable events documented.  Last Vitals:  Vitals:   06/23/22 1114 06/23/22 1603  BP: (!) 107/58 (!) 100/42  Pulse: 63 (!) 56  Resp: 16 16  Temp: 36.8 C 36.8 C  SpO2: 100% 100%    Last Pain:  Vitals:   06/23/22 1603  TempSrc: Oral  PainSc:                  Effie Berkshire

## 2022-06-23 NOTE — Progress Notes (Signed)
Pt came up to the floor from PACU c/o foley. Pt insisted that foley comes out and is willing to get OOB. In process of getting patient OOB, pt c/o left side pain while the nurse assisted. Pt needed to get back in bed, pain 10/10. Gave pain meds, reach out the MD.   MD call back gave order for dilaudid '1mg'$  to give NOW. Charge RN is aware of the situation.

## 2022-06-24 ENCOUNTER — Encounter (HOSPITAL_COMMUNITY): Payer: Self-pay | Admitting: Obstetrics and Gynecology

## 2022-06-24 DIAGNOSIS — N920 Excessive and frequent menstruation with regular cycle: Secondary | ICD-10-CM | POA: Diagnosis not present

## 2022-06-24 LAB — BASIC METABOLIC PANEL
Anion gap: 5 (ref 5–15)
BUN: 8 mg/dL (ref 6–20)
CO2: 23 mmol/L (ref 22–32)
Calcium: 8.1 mg/dL — ABNORMAL LOW (ref 8.9–10.3)
Chloride: 108 mmol/L (ref 98–111)
Creatinine, Ser: 0.74 mg/dL (ref 0.44–1.00)
GFR, Estimated: >60 mL/min (ref >60)
Glucose, Bld: 149 mg/dL — ABNORMAL HIGH (ref 70–99)
Potassium: 4.2 mmol/L (ref 3.5–5.1)
Sodium: 136 mmol/L (ref 135–145)

## 2022-06-24 LAB — CBC
HCT: 28.6 % — ABNORMAL LOW (ref 36.0–46.0)
Hemoglobin: 9.7 g/dL — ABNORMAL LOW (ref 12.0–15.0)
MCH: 30.8 pg (ref 26.0–34.0)
MCHC: 33.9 g/dL (ref 30.0–36.0)
MCV: 90.8 fL (ref 80.0–100.0)
Platelets: 213 10*3/uL (ref 150–400)
RBC: 3.15 MIL/uL — ABNORMAL LOW (ref 3.87–5.11)
RDW: 12.8 % (ref 11.5–15.5)
WBC: 7 10*3/uL (ref 4.0–10.5)
nRBC: 0 % (ref 0.0–0.2)

## 2022-06-24 LAB — SURGICAL PATHOLOGY

## 2022-06-24 MED ORDER — ACETAMINOPHEN 500 MG PO TABS
1000.0000 mg | ORAL_TABLET | Freq: Four times a day (QID) | ORAL | 0 refills | Status: AC
Start: 2022-06-24 — End: ?

## 2022-06-24 MED ORDER — OXYCODONE HCL 5 MG PO TABS: 5.0000 mg | ORAL_TABLET | ORAL | 0 refills | Status: AC | PRN

## 2022-06-24 MED ORDER — ONDANSETRON HCL 4 MG PO TABS: 4.0000 mg | ORAL_TABLET | Freq: Four times a day (QID) | ORAL | 0 refills | Status: AC | PRN

## 2022-06-24 MED ORDER — IBUPROFEN 600 MG PO TABS
600.0000 mg | ORAL_TABLET | Freq: Four times a day (QID) | ORAL | 0 refills | Status: AC
Start: 2022-06-24 — End: ?

## 2022-06-24 NOTE — Progress Notes (Signed)
Pt foley dc's at 0630. Pt ambulated to BR x 1 assist, voided clear yellow urine, no pain, no bleeding noted. PIV saline locked, pt in bed, denies distress, call bell in reach

## 2022-06-24 NOTE — Progress Notes (Signed)
POD #1  Pt reports feeling much better this AM.  Still sore, but manageable with po meds.  Ambulating and went to bathroom and voided after catheter removed.  Tolerating po  Afeb VSS Abdomen soft Incisions well-approximated  Hgb 9.7, appropriate drop.     D/c home Incision check 2 weeks Reviewed d/c instructions in detail

## 2022-08-31 DIAGNOSIS — R350 Frequency of micturition: Secondary | ICD-10-CM | POA: Diagnosis not present

## 2022-09-02 DIAGNOSIS — M5416 Radiculopathy, lumbar region: Secondary | ICD-10-CM | POA: Diagnosis not present

## 2022-09-02 DIAGNOSIS — G894 Chronic pain syndrome: Secondary | ICD-10-CM | POA: Diagnosis not present

## 2022-09-15 DIAGNOSIS — M5416 Radiculopathy, lumbar region: Secondary | ICD-10-CM | POA: Diagnosis not present

## 2022-09-21 DIAGNOSIS — M5416 Radiculopathy, lumbar region: Secondary | ICD-10-CM | POA: Diagnosis not present

## 2022-09-21 DIAGNOSIS — G894 Chronic pain syndrome: Secondary | ICD-10-CM | POA: Diagnosis not present

## 2022-09-21 DIAGNOSIS — M792 Neuralgia and neuritis, unspecified: Secondary | ICD-10-CM | POA: Diagnosis not present

## 2022-09-28 DIAGNOSIS — M5116 Intervertebral disc disorders with radiculopathy, lumbar region: Secondary | ICD-10-CM | POA: Diagnosis not present

## 2022-10-05 DIAGNOSIS — N941 Unspecified dyspareunia: Secondary | ICD-10-CM | POA: Diagnosis not present

## 2022-10-17 ENCOUNTER — Other Ambulatory Visit: Payer: Self-pay

## 2022-10-17 ENCOUNTER — Emergency Department (HOSPITAL_BASED_OUTPATIENT_CLINIC_OR_DEPARTMENT_OTHER): Payer: BC Managed Care – PPO

## 2022-10-17 ENCOUNTER — Emergency Department (HOSPITAL_BASED_OUTPATIENT_CLINIC_OR_DEPARTMENT_OTHER)
Admission: EM | Admit: 2022-10-17 | Discharge: 2022-10-17 | Disposition: A | Discharge status: Home / Self Care | Payer: BC Managed Care – PPO | Attending: Emergency Medicine | Admitting: Emergency Medicine

## 2022-10-17 ENCOUNTER — Encounter (HOSPITAL_BASED_OUTPATIENT_CLINIC_OR_DEPARTMENT_OTHER): Payer: Self-pay | Admitting: Emergency Medicine

## 2022-10-17 DIAGNOSIS — W540XXS Bitten by dog, sequela: Secondary | ICD-10-CM

## 2022-10-17 DIAGNOSIS — M7989 Other specified soft tissue disorders: Secondary | ICD-10-CM | POA: Diagnosis not present

## 2022-10-17 DIAGNOSIS — S61251S Open bite of left index finger without damage to nail, sequela: Secondary | ICD-10-CM | POA: Insufficient documentation

## 2022-10-17 DIAGNOSIS — Z23 Encounter for immunization: Secondary | ICD-10-CM | POA: Insufficient documentation

## 2022-10-17 DIAGNOSIS — W540XXD Bitten by dog, subsequent encounter: Secondary | ICD-10-CM | POA: Insufficient documentation

## 2022-10-17 DIAGNOSIS — S61251A Open bite of left index finger without damage to nail, initial encounter: Secondary | ICD-10-CM | POA: Diagnosis not present

## 2022-10-17 DIAGNOSIS — S61251D Open bite of left index finger without damage to nail, subsequent encounter: Secondary | ICD-10-CM | POA: Diagnosis not present

## 2022-10-17 DIAGNOSIS — S61452A Open bite of left hand, initial encounter: Secondary | ICD-10-CM | POA: Diagnosis not present

## 2022-10-17 MED ORDER — TETANUS-DIPHTH-ACELL PERTUSSIS 5-2.5-18.5 LF-MCG/0.5 IM SUSY
0.5000 mL | PREFILLED_SYRINGE | Freq: Once | INTRAMUSCULAR | Status: AC
  Administered 2022-10-17: 0.5 mL via INTRAMUSCULAR
  Filled 2022-10-17: qty 0.5

## 2022-10-17 MED ORDER — AMOXICILLIN-POT CLAVULANATE 875-125 MG PO TABS
1.0000 | ORAL_TABLET | Freq: Once | ORAL | Status: DC
  Filled 2022-10-17: qty 1

## 2022-10-17 MED ORDER — ACETAMINOPHEN 500 MG PO TABS
1000.0000 mg | ORAL_TABLET | Freq: Once | ORAL | Status: AC
  Administered 2022-10-17: 1000 mg via ORAL
  Filled 2022-10-17: qty 2

## 2022-10-17 MED ORDER — IBUPROFEN 800 MG PO TABS
800.0000 mg | ORAL_TABLET | Freq: Once | ORAL | Status: AC
  Administered 2022-10-17: 800 mg via ORAL
  Filled 2022-10-17: qty 1

## 2022-10-17 MED ORDER — NAPROXEN 375 MG PO TABS: 375.0000 mg | ORAL_TABLET | Freq: Two times a day (BID) | ORAL | 0 refills | Status: AC

## 2022-10-17 MED ORDER — AMOXICILLIN-POT CLAVULANATE 875-125 MG PO TABS: 1.0000 | ORAL_TABLET | Freq: Two times a day (BID) | ORAL | 0 refills | Status: DC

## 2022-10-17 NOTE — Discharge Instructions (Addendum)
Original dial liquid soap soaks twice daily. Please call hand surgery to be seen in follow up

## 2022-10-17 NOTE — ED Provider Notes (Signed)
[facility] EMERGENCY DEPARTMENT Provider Note   CSN: [id number] Arrival date & time: 10/17/22  0141     History  Chief Complaint  Patient presents with   Wound Check    Rose [name] is a 41 y.o. female.  The history is provided by the patient.  Wound Check This is a new problem. The current episode started more than 2 days ago (5 days ago, bit by incissor of patient's new puppy.  No fevers but swelling and pain at the tip of the digit .  No drainage no streaking.). The problem occurs constantly. The problem has been gradually worsening. Nothing aggravates the symptoms. Nothing relieves the symptoms. She has tried nothing (one dose of old Augmentin) for the symptoms. The treatment provided no relief.       Home Medications Prior to Admission medications   Medication Sig Start Date End Date Taking? Authorizing Provider  amoxicillin-clavulanate (AUGMENTIN) 875-125 MG tablet Take 1 tablet by mouth every 12 (twelve) hours. 10/17/22  Yes Jimmi Sidener, MD  naproxen (NAPROSYN) 375 MG tablet Take 1 tablet (375 mg total) by mouth 2 (two) times daily. 10/17/22  Yes Miri Jose, MD  acetaminophen (TYLENOL) 500 MG tablet Take 2 tablets (1,000 mg total) by mouth every 6 (six) hours. 06/24/22   Paula Compton, MD  ibuprofen (ADVIL) 600 MG tablet Take 1 tablet (600 mg total) by mouth every 6 (six) hours. 06/24/22   Paula Compton, MD  ondansetron (ZOFRAN) 4 MG tablet Take 1 tablet (4 mg total) by mouth every 6 (six) hours as needed for nausea. 06/24/22   Paula Compton, MD  oxyCODONE (OXY IR/ROXICODONE) 5 MG immediate release tablet Take 1-2 tablets (5-10 mg total) by mouth every 4 (four) hours as needed for moderate pain. 06/24/22   Paula Compton, MD  valACYclovir (VALTREX) 1000 MG tablet Take 1,000 mg by mouth daily.    [provider]      Allergies    Tape and Codeine    Review of Systems   Review of Systems  Constitutional:  Negative for fever.   HENT:  Negative for facial swelling.   Gastrointestinal:  Negative for vomiting.  Musculoskeletal:  Positive for arthralgias.  All other systems reviewed and are negative.   Physical Exam Updated Vital Signs BP 124/80 (BP Location: Right Arm)   Pulse 88   Temp 98.4 F (36.9 C) (Oral)   Resp 18   Ht '5\' 7"'$  (1.702 m)   Wt 54 kg   SpO2 100%   BMI 18.64 kg/m  Physical Exam Vitals and nursing note reviewed.  Constitutional:      General: She is not in acute distress.    Appearance: Normal appearance. She is well-developed.  HENT:     Head: Normocephalic and atraumatic.  Eyes:     Pupils: Pupils are equal, round, and reactive to light.  Cardiovascular:     Rate and Rhythm: Normal rate and regular rhythm.     Pulses: Normal pulses.     Heart sounds: Normal heart sounds.  Pulmonary:     Effort: Pulmonary effort is normal. No respiratory distress.     Breath sounds: Normal breath sounds.  Abdominal:     General: Bowel sounds are normal. There is no distension.     Palpations: Abdomen is soft.     Tenderness: There is no abdominal tenderness. There is no guarding or rebound.  Genitourinary:    Vagina: No vaginal discharge.  Musculoskeletal:  General: Normal range of motion.       Arms:     Cervical back: Normal range of motion and neck supple.  Skin:    General: Skin is warm and dry.     Capillary Refill: Capillary refill takes less than 2 seconds.     Findings: No erythema or rash.  Neurological:     General: No focal deficit present.     Mental Status: She is alert.     Deep Tendon Reflexes: Reflexes normal.  Psychiatric:        Mood and Affect: Mood normal.     ED Results / Procedures / Treatments   Labs (all labs ordered are listed, but only abnormal results are displayed) Labs Reviewed - No data to display  EKG None  Radiology DG Hand Complete Left  Result Date: 10/17/2022 CLINICAL DATA:  Dog bite a few days ago with left index finger pain.  EXAM: LEFT HAND - COMPLETE 3+ VIEW COMPARISON:  None Available. FINDINGS: There is no evidence of fracture or dislocation. There is no evidence of arthropathy or other focal bone abnormality. There is mild soft tissue swelling in the index finger. There is a short linear density, probably calcification at the radial aspect of the first metacarpal head, which is probably either in the radial collateral ligament or in the superimposed tendon, alternatively could be a very small foreign body. IMPRESSION: 1. No evidence of fracture or dislocation. 2. Mild soft tissue swelling in the index finger. 3. Short linear density at the radial aspect of the first metacarpal head, probably calcification in the radial collateral ligament or adjacent tendon, alternatively could be a very small foreign body. Electronically Signed   By: Telford Nab M.D.   On: 10/17/2022 05:26    Procedures Procedures    Medications Ordered in ED Medications  amoxicillin-clavulanate (AUGMENTIN) 875-125 MG per tablet 1 tablet (1 tablet Oral Patient Refused/Not Given 10/17/22 0410)  Tdap (BOOSTRIX) injection 0.5 mL (0.5 mLs Intramuscular Given 10/17/22 0409)  ibuprofen (ADVIL) tablet 800 mg (800 mg Oral Given 10/17/22 0409)  acetaminophen (TYLENOL) tablet 1,000 mg (1,000 mg Oral Given 10/17/22 0408)    ED Course/ Medical Decision Making/ A&P                           Medical Decision Making Patient was bit at the medial aspect of the L index finger cuticle and has pain and swelling of the tip of the finger with one dose of antibiotics taken    Amount and/or Complexity of Data Reviewed External Data Reviewed: notes.    Details: Previous notes reviewed  Radiology: ordered and independent interpretation performed.    Details: No foreign body in the index finger.  No gas in the tissues.    Risk OTC drugs. Prescription drug management. Risk Details: Infected dog bite of the finger. Tetanus updated in the ED for tetanus prone  wound.  At this time there are no signs of flexor tenosynovitis.  No paronychia.  Nothing to drain in the ED.  I have initiated Augmentin therapy for 10 days and as wound care in the ED was provided.  Patient will need to continue dial soap soaks twice daily and elevated the digit.  Patient was informed that she must follow up with hand surgery for ongoing care and should call immediately on Monday for close follow up.  Strict return precautions.      Final Clinical Impression(s) / ED  Diagnoses Final diagnoses:  Dog bite, sequela   Return for intractable cough, coughing up blood, fevers > 100.4 unrelieved by medication, shortness of breath, intractable vomiting, chest pain, shortness of breath, weakness, numbness, changes in speech, facial asymmetry, abdominal pain, passing out, Inability to tolerate liquids or food, cough, altered mental status or any concerns. No signs of systemic illness or infection. The patient is nontoxic-appearing on exam and vital signs are within normal limits.  I have reviewed the triage vital signs and the nursing notes. Pertinent labs & imaging results that were available during my care of the patient were reviewed by me and considered in my medical decision making (see chart for details). After history, exam, and medical workup I feel the patient has been appropriately medically screened and is safe for discharge home. Pertinent diagnoses were discussed with the patient. Patient was given return precautions.  Rx / DC Orders ED Discharge Orders          Ordered    amoxicillin-clavulanate (AUGMENTIN) 875-125 MG tablet  Every 12 hours        10/17/22 0245    naproxen (NAPROSYN) 375 MG tablet  2 times daily        10/17/22 0503              Hugo Lybrand, MD 10/17/22 4967

## 2022-10-17 NOTE — ED Triage Notes (Signed)
Pt states she was accidentally bit by her new puppy "a couple days ago" to L index finger next to fingernail. Pt states she took one dose of Augmentin she had leftover from previous illness. Finger swollen and painful.

## 2022-10-17 NOTE — ED Notes (Signed)
Pt reports taking a dose of Augmentin at home at 2300 last night, pt reports same dose ordered was taken

## 2022-10-22 DIAGNOSIS — M5126 Other intervertebral disc displacement, lumbar region: Secondary | ICD-10-CM | POA: Diagnosis not present

## 2022-11-09 DIAGNOSIS — M5416 Radiculopathy, lumbar region: Secondary | ICD-10-CM | POA: Diagnosis not present

## 2022-11-16 DIAGNOSIS — N393 Stress incontinence (female) (male): Secondary | ICD-10-CM | POA: Diagnosis not present

## 2022-11-16 DIAGNOSIS — M5416 Radiculopathy, lumbar region: Secondary | ICD-10-CM | POA: Diagnosis not present

## 2022-11-16 DIAGNOSIS — M62838 Other muscle spasm: Secondary | ICD-10-CM | POA: Diagnosis not present

## 2022-11-16 DIAGNOSIS — M545 Low back pain, unspecified: Secondary | ICD-10-CM | POA: Diagnosis not present

## 2022-11-16 DIAGNOSIS — N9412 Deep dyspareunia: Secondary | ICD-10-CM | POA: Diagnosis not present

## 2022-11-23 DIAGNOSIS — M545 Low back pain, unspecified: Secondary | ICD-10-CM | POA: Diagnosis not present

## 2022-11-23 DIAGNOSIS — M5416 Radiculopathy, lumbar region: Secondary | ICD-10-CM | POA: Diagnosis not present

## 2022-11-25 DIAGNOSIS — M545 Low back pain, unspecified: Secondary | ICD-10-CM | POA: Diagnosis not present

## 2022-11-25 DIAGNOSIS — M5416 Radiculopathy, lumbar region: Secondary | ICD-10-CM | POA: Diagnosis not present

## 2022-11-30 DIAGNOSIS — M545 Low back pain, unspecified: Secondary | ICD-10-CM | POA: Diagnosis not present

## 2022-11-30 DIAGNOSIS — M5416 Radiculopathy, lumbar region: Secondary | ICD-10-CM | POA: Diagnosis not present

## 2022-12-03 DIAGNOSIS — N9412 Deep dyspareunia: Secondary | ICD-10-CM | POA: Diagnosis not present

## 2022-12-03 DIAGNOSIS — R102 Pelvic and perineal pain: Secondary | ICD-10-CM | POA: Diagnosis not present

## 2022-12-06 DIAGNOSIS — M62838 Other muscle spasm: Secondary | ICD-10-CM | POA: Diagnosis not present

## 2022-12-06 DIAGNOSIS — M6289 Other specified disorders of muscle: Secondary | ICD-10-CM | POA: Diagnosis not present

## 2022-12-06 DIAGNOSIS — M6281 Muscle weakness (generalized): Secondary | ICD-10-CM | POA: Diagnosis not present

## 2022-12-06 DIAGNOSIS — N3946 Mixed incontinence: Secondary | ICD-10-CM | POA: Diagnosis not present

## 2022-12-07 ENCOUNTER — Telehealth: Payer: Self-pay | Admitting: Cardiology

## 2022-12-07 DIAGNOSIS — M62838 Other muscle spasm: Secondary | ICD-10-CM | POA: Diagnosis not present

## 2022-12-07 DIAGNOSIS — N3946 Mixed incontinence: Secondary | ICD-10-CM | POA: Diagnosis not present

## 2022-12-07 DIAGNOSIS — M6281 Muscle weakness (generalized): Secondary | ICD-10-CM | POA: Diagnosis not present

## 2022-12-07 DIAGNOSIS — M6289 Other specified disorders of muscle: Secondary | ICD-10-CM | POA: Diagnosis not present

## 2022-12-07 NOTE — Telephone Encounter (Signed)
Office calling  to patient last office notes and procedures. Fax number [phone] Please advise

## 2022-12-07 NOTE — Telephone Encounter (Signed)
[facility] need the most recent office visit notes, Echo, and EKG faxed.   All these documents Faxed to [phone]

## 2022-12-10 DIAGNOSIS — M5416 Radiculopathy, lumbar region: Secondary | ICD-10-CM | POA: Diagnosis not present

## 2022-12-10 DIAGNOSIS — M5116 Intervertebral disc disorders with radiculopathy, lumbar region: Secondary | ICD-10-CM | POA: Diagnosis not present

## 2022-12-24 DIAGNOSIS — M79662 Pain in left lower leg: Secondary | ICD-10-CM | POA: Diagnosis not present

## 2022-12-29 DIAGNOSIS — Z1231 Encounter for screening mammogram for malignant neoplasm of breast: Secondary | ICD-10-CM | POA: Diagnosis not present

## 2022-12-29 DIAGNOSIS — E559 Vitamin D deficiency, unspecified: Secondary | ICD-10-CM | POA: Diagnosis not present

## 2022-12-29 DIAGNOSIS — Z13220 Encounter for screening for lipoid disorders: Secondary | ICD-10-CM | POA: Diagnosis not present

## 2022-12-29 DIAGNOSIS — Z01419 Encounter for gynecological examination (general) (routine) without abnormal findings: Secondary | ICD-10-CM | POA: Diagnosis not present

## 2022-12-29 DIAGNOSIS — Z13228 Encounter for screening for other metabolic disorders: Secondary | ICD-10-CM | POA: Diagnosis not present

## 2022-12-29 DIAGNOSIS — Z1389 Encounter for screening for other disorder: Secondary | ICD-10-CM | POA: Diagnosis not present

## 2022-12-29 DIAGNOSIS — Z130 Encounter for screening for diseases of the blood and blood-forming organs and certain disorders involving the immune mechanism: Secondary | ICD-10-CM | POA: Diagnosis not present

## 2022-12-30 ENCOUNTER — Encounter: Payer: Self-pay | Admitting: Nurse Practitioner

## 2022-12-31 DIAGNOSIS — M4726 Other spondylosis with radiculopathy, lumbar region: Secondary | ICD-10-CM | POA: Diagnosis not present

## 2022-12-31 DIAGNOSIS — Z9889 Other specified postprocedural states: Secondary | ICD-10-CM | POA: Diagnosis not present

## 2023-01-04 DIAGNOSIS — M7138 Other bursal cyst, other site: Secondary | ICD-10-CM | POA: Diagnosis not present

## 2023-01-14 DIAGNOSIS — M6289 Other specified disorders of muscle: Secondary | ICD-10-CM | POA: Diagnosis not present

## 2023-01-14 DIAGNOSIS — M62838 Other muscle spasm: Secondary | ICD-10-CM | POA: Diagnosis not present

## 2023-01-14 DIAGNOSIS — M6281 Muscle weakness (generalized): Secondary | ICD-10-CM | POA: Diagnosis not present

## 2023-01-14 DIAGNOSIS — N3946 Mixed incontinence: Secondary | ICD-10-CM | POA: Diagnosis not present

## 2023-03-01 DIAGNOSIS — M62838 Other muscle spasm: Secondary | ICD-10-CM | POA: Diagnosis not present

## 2023-03-01 DIAGNOSIS — R102 Pelvic and perineal pain: Secondary | ICD-10-CM | POA: Diagnosis not present

## 2023-03-01 DIAGNOSIS — M6281 Muscle weakness (generalized): Secondary | ICD-10-CM | POA: Diagnosis not present

## 2023-03-01 DIAGNOSIS — M6289 Other specified disorders of muscle: Secondary | ICD-10-CM | POA: Diagnosis not present

## 2023-03-04 DIAGNOSIS — R102 Pelvic and perineal pain: Secondary | ICD-10-CM | POA: Diagnosis not present

## 2023-03-04 DIAGNOSIS — N9412 Deep dyspareunia: Secondary | ICD-10-CM | POA: Diagnosis not present

## 2023-03-09 DIAGNOSIS — R531 Weakness: Secondary | ICD-10-CM | POA: Diagnosis not present

## 2023-03-09 DIAGNOSIS — M545 Low back pain, unspecified: Secondary | ICD-10-CM | POA: Diagnosis not present

## 2023-03-18 DIAGNOSIS — M6281 Muscle weakness (generalized): Secondary | ICD-10-CM | POA: Diagnosis not present

## 2023-03-18 DIAGNOSIS — M62838 Other muscle spasm: Secondary | ICD-10-CM | POA: Diagnosis not present

## 2023-03-18 DIAGNOSIS — M6289 Other specified disorders of muscle: Secondary | ICD-10-CM | POA: Diagnosis not present

## 2023-03-18 DIAGNOSIS — R102 Pelvic and perineal pain: Secondary | ICD-10-CM | POA: Diagnosis not present

## 2023-03-22 DIAGNOSIS — R531 Weakness: Secondary | ICD-10-CM | POA: Diagnosis not present

## 2023-03-22 DIAGNOSIS — M545 Low back pain, unspecified: Secondary | ICD-10-CM | POA: Diagnosis not present

## 2023-03-22 DIAGNOSIS — N393 Stress incontinence (female) (male): Secondary | ICD-10-CM | POA: Diagnosis not present

## 2023-03-22 DIAGNOSIS — R102 Pelvic and perineal pain: Secondary | ICD-10-CM | POA: Diagnosis not present

## 2023-03-22 DIAGNOSIS — M6281 Muscle weakness (generalized): Secondary | ICD-10-CM | POA: Diagnosis not present

## 2023-03-29 DIAGNOSIS — M545 Low back pain, unspecified: Secondary | ICD-10-CM | POA: Diagnosis not present

## 2023-03-29 DIAGNOSIS — R531 Weakness: Secondary | ICD-10-CM | POA: Diagnosis not present

## 2023-04-05 DIAGNOSIS — R531 Weakness: Secondary | ICD-10-CM | POA: Diagnosis not present

## 2023-04-05 DIAGNOSIS — M5416 Radiculopathy, lumbar region: Secondary | ICD-10-CM | POA: Diagnosis not present

## 2023-04-05 DIAGNOSIS — M545 Low back pain, unspecified: Secondary | ICD-10-CM | POA: Diagnosis not present

## 2023-04-11 DIAGNOSIS — M545 Low back pain, unspecified: Secondary | ICD-10-CM | POA: Diagnosis not present

## 2023-04-11 DIAGNOSIS — R531 Weakness: Secondary | ICD-10-CM | POA: Diagnosis not present

## 2023-04-26 DIAGNOSIS — R531 Weakness: Secondary | ICD-10-CM | POA: Diagnosis not present

## 2023-04-26 DIAGNOSIS — M545 Low back pain, unspecified: Secondary | ICD-10-CM | POA: Diagnosis not present

## 2023-04-29 DIAGNOSIS — D225 Melanocytic nevi of trunk: Secondary | ICD-10-CM | POA: Diagnosis not present

## 2023-04-29 DIAGNOSIS — D2262 Melanocytic nevi of left upper limb, including shoulder: Secondary | ICD-10-CM | POA: Diagnosis not present

## 2023-04-29 DIAGNOSIS — Z85828 Personal history of other malignant neoplasm of skin: Secondary | ICD-10-CM | POA: Diagnosis not present

## 2023-04-29 DIAGNOSIS — D2261 Melanocytic nevi of right upper limb, including shoulder: Secondary | ICD-10-CM | POA: Diagnosis not present

## 2023-06-03 DIAGNOSIS — H33321 Round hole, right eye: Secondary | ICD-10-CM | POA: Diagnosis not present

## 2023-06-03 DIAGNOSIS — H5203 Hypermetropia, bilateral: Secondary | ICD-10-CM | POA: Diagnosis not present

## 2023-06-07 DIAGNOSIS — R42 Dizziness and giddiness: Secondary | ICD-10-CM | POA: Diagnosis not present

## 2023-06-07 DIAGNOSIS — M545 Low back pain, unspecified: Secondary | ICD-10-CM | POA: Diagnosis not present

## 2023-06-09 ENCOUNTER — Encounter: Payer: Self-pay | Admitting: Nurse Practitioner

## 2023-06-09 ENCOUNTER — Ambulatory Visit: Payer: BC Managed Care – PPO | Admitting: Nurse Practitioner

## 2023-06-09 VITALS — BP 104/70 | HR 91 | Temp 97.0°F | Ht 67.0 in | Wt 123.4 lb

## 2023-06-09 DIAGNOSIS — R42 Dizziness and giddiness: Secondary | ICD-10-CM | POA: Diagnosis not present

## 2023-06-09 MED ORDER — MECLIZINE HCL 12.5 MG PO TABS: 12.5000 mg | ORAL_TABLET | Freq: Three times a day (TID) | ORAL | 0 refills | Status: AC | PRN

## 2023-06-09 MED ORDER — MECLIZINE HCL 12.5 MG PO TABS: 12.5000 mg | ORAL_TABLET | Freq: Three times a day (TID) | ORAL | 0 refills | Status: DC | PRN

## 2023-06-09 NOTE — Progress Notes (Signed)
Acute Office Visit  Subjective:     Patient ID: Rose [name], female    DOB: 22-Nov-1980, 42 y.o.   MRN: [id number]  Chief Complaint  Patient presents with   Dizziness    ongoing for 2 weeks, nauseated with mild head pain    HPI Patient is in today for dizziness for 2 weeks. She went to the eye doctor a week and a half ago and nothing was found other than a slight change in her vision. She has been trying to make sure that she has been eating and drinking regularly. She saw her physical therapist who did some maneuver and doesn't feel like it is BPPV. She also notes that her maternal grandmother had a brain tumor. She states that dramamine has not helped and laying completely still will help.   DIZZINESS  Duration: 2 weeks Description of symptoms: lightheaded  Duration of episode: hours - from when she gets up to when she goes to bed Dizziness frequency: no history of the same Provoking factors:  bending over, turning her head Aggravating factors:   bending over, turning head Triggered by rolling over in bed: yes Triggered by bending over: yes Aggravated by head movement: yes Aggravated by exertion, coughing, loud noises: yes Recent head injury: no Recent or current viral symptoms: no History of vasovagal episodes: no Nausea: yes Vomiting: no Tinnitus: no Hearing loss: no Aural fullness: no Headache: yes Photophobia/phonophobia: no Unsteady gait: yes Postural instability: no Diplopia, dysarthria, dysphagia or weakness: no Related to exertion: no Pallor: yes Diaphoresis: no Dyspnea: no Chest pain: no  ROS See pertinent positives and negatives per HPI.     Objective:    BP 104/70 (BP Location: Left Arm)   Pulse 91   Temp (!) 97 F (36.1 C)   Ht 5\' 7"  (1.702 m)   Wt 123 lb 6.4 oz (56 kg)   Breastfeeding No   BMI 19.33 kg/m    Orthostatic VS for the past 72 hrs (Last 3 readings):  Orthostatic BP Patient Position BP Location  06/09/23 1447 114/80  Standing --  06/09/23 1446 112/70 Sitting --  06/09/23 1445 112/78 Supine --  06/09/23 1427 -- -- Left Arm     Physical Exam Vitals and nursing note reviewed.  Constitutional:      General: She is not in acute distress.    Appearance: Normal appearance.  HENT:     Head: Normocephalic.  Eyes:     Extraocular Movements: Extraocular movements intact.     Conjunctiva/sclera: Conjunctivae normal.     Pupils: Pupils are equal, round, and reactive to light.  Cardiovascular:     Rate and Rhythm: Normal rate and regular rhythm.     Pulses: Normal pulses.     Heart sounds: Normal heart sounds.  Pulmonary:     Effort: Pulmonary effort is normal.     Breath sounds: Normal breath sounds.  Musculoskeletal:     Cervical back: Normal range of motion.  Skin:    General: Skin is warm.  Neurological:     General: No focal deficit present.     Mental Status: She is alert and oriented to person, place, and time.     Cranial Nerves: No cranial nerve deficit.     Motor: No weakness.     Coordination: Coordination normal.     Gait: Gait normal.     Comments: Dix hallpike maneuver negative   Psychiatric:        Mood and Affect: Mood  normal.        Behavior: Behavior normal.        Thought Content: Thought content normal.        Judgment: Judgment normal.        Assessment & Plan:   Problem List Items Addressed This Visit       Other   Dizziness - Primary    She has been having dizziness for 2 weeks.  She states that it started since she gets out of bed and is below the level of lightheadedness all day until she lays back down again for bed.  This gets worse with the room spinning when she bends over or stands up and changes positions quickly.  She denies head injury.  She states that is getting worse.  She saw her eye doctor who was told that she has a slight change in her vision otherwise it is normal.  She has been doing movers from her physical therapist which have not helped.   Dix-Hallpike was negative and orthostatics were negative.  Will check CMP, CBC, TSH, A1c today.  She does have a family history of brain tumor which she is concerned about with the headaches as well.  Will order a MRI of her brain.  Will also place a referral to ENT.  Start meclizine 12.5 to 25 mg every 8 hours as needed for dizziness.  Follow-up after MRI.      Relevant Orders   CBC with Differential/Platelet   Comprehensive metabolic panel   TSH   Hemoglobin A1c   Ambulatory referral to ENT   MR Brain W Wo Contrast    Meds ordered this encounter  Medications   DISCONTD: meclizine (ANTIVERT) 12.5 MG tablet    Sig: Take 1-2 tablets (12.5-25 mg total) by mouth 3 (three) times daily as needed for dizziness.    Dispense:  30 tablet    Refill:  0   meclizine (ANTIVERT) 12.5 MG tablet    Sig: Take 1-2 tablets (12.5-25 mg total) by mouth 3 (three) times daily as needed for dizziness.    Dispense:  30 tablet    Refill:  0    Return for after MRI.  Gerre Scull, NP

## 2023-06-09 NOTE — Assessment & Plan Note (Addendum)
She has been having dizziness for 2 weeks.  She states that it started since she gets out of bed and is below the level of lightheadedness all day until she lays back down again for bed.  This gets worse with the room spinning when she bends over or stands up and changes positions quickly.  She denies head injury.  She states that is getting worse.  She saw her eye doctor who was told that she has a slight change in her vision otherwise it is normal.  She has been doing movers from her physical therapist which have not helped.  Dix-Hallpike was negative and orthostatics were negative.  Will check CMP, CBC, TSH, A1c today.  She does have a family history of brain tumor which she is concerned about with the headaches as well.  Will order a MRI of her brain.  Will also place a referral to ENT.  Start meclizine 12.5 to 25 mg every 8 hours as needed for dizziness.  Follow-up after MRI.

## 2023-06-09 NOTE — Patient Instructions (Signed)
It was great to see you!  Drink plenty of water.   Start meclizine 1-2 tablets as needed for dizziness.  We are checking your labs today and will let you know the results via mychart/phone.   I have ordered a MRI, they will call to schedule   I have placed a referral to ENT.   Start the exercises daily.   Let's follow-up after your MRI  Take care,  Rodman Pickle, NP

## 2023-06-10 DIAGNOSIS — H31091 Other chorioretinal scars, right eye: Secondary | ICD-10-CM | POA: Diagnosis not present

## 2023-06-10 DIAGNOSIS — H43823 Vitreomacular adhesion, bilateral: Secondary | ICD-10-CM | POA: Diagnosis not present

## 2023-06-16 ENCOUNTER — Encounter: Payer: Self-pay | Admitting: Nurse Practitioner

## 2023-06-16 DIAGNOSIS — R799 Abnormal finding of blood chemistry, unspecified: Secondary | ICD-10-CM

## 2023-06-21 DIAGNOSIS — M545 Low back pain, unspecified: Secondary | ICD-10-CM | POA: Diagnosis not present

## 2023-06-21 DIAGNOSIS — R42 Dizziness and giddiness: Secondary | ICD-10-CM | POA: Diagnosis not present

## 2023-06-21 DIAGNOSIS — M542 Cervicalgia: Secondary | ICD-10-CM | POA: Diagnosis not present

## 2023-06-28 ENCOUNTER — Ambulatory Visit
Admission: RE | Admit: 2023-06-28 | Discharge: 2023-06-28 | Disposition: A | Discharge status: Home / Self Care | Payer: BC Managed Care – PPO | Source: Ambulatory Visit | Attending: Nurse Practitioner | Admitting: Nurse Practitioner

## 2023-06-28 DIAGNOSIS — R55 Syncope and collapse: Secondary | ICD-10-CM | POA: Diagnosis not present

## 2023-06-28 DIAGNOSIS — R42 Dizziness and giddiness: Secondary | ICD-10-CM

## 2023-06-28 MED ORDER — GADOPICLENOL 0.5 MMOL/ML IV SOLN
6.0000 mL | Freq: Once | INTRAVENOUS | Status: AC | PRN
  Administered 2023-06-28: 6 mL via INTRAVENOUS

## 2023-07-05 DIAGNOSIS — R42 Dizziness and giddiness: Secondary | ICD-10-CM | POA: Diagnosis not present

## 2023-07-05 DIAGNOSIS — M545 Low back pain, unspecified: Secondary | ICD-10-CM | POA: Diagnosis not present

## 2023-07-05 DIAGNOSIS — M542 Cervicalgia: Secondary | ICD-10-CM | POA: Diagnosis not present

## 2023-07-08 DIAGNOSIS — H33331 Multiple defects of retina without detachment, right eye: Secondary | ICD-10-CM | POA: Diagnosis not present

## 2023-07-11 NOTE — Addendum Note (Signed)
Addended by: Rodman Pickle A on: 07/11/2023 04:59 PM   Modules accepted: Orders

## 2023-07-12 DIAGNOSIS — R42 Dizziness and giddiness: Secondary | ICD-10-CM | POA: Diagnosis not present

## 2023-07-14 ENCOUNTER — Encounter: Payer: Self-pay | Admitting: Internal Medicine

## 2023-07-14 ENCOUNTER — Ambulatory Visit: Payer: BC Managed Care – PPO | Admitting: Internal Medicine

## 2023-07-14 VITALS — BP 96/68 | HR 87 | Temp 98.4°F | Ht 67.0 in | Wt 123.4 lb

## 2023-07-14 DIAGNOSIS — J069 Acute upper respiratory infection, unspecified: Secondary | ICD-10-CM

## 2023-07-14 LAB — POCT INFLUENZA A/B
Influenza A, POC: NEGATIVE
Influenza B, POC: NEGATIVE

## 2023-07-14 LAB — POC COVID19 BINAXNOW: SARS Coronavirus 2 Ag: NEGATIVE

## 2023-07-14 LAB — POCT RAPID STREP A (OFFICE): Rapid Strep A Screen: NEGATIVE

## 2023-07-14 MED ORDER — AMOXICILLIN-POT CLAVULANATE 875-125 MG PO TABS
1.0000 | ORAL_TABLET | Freq: Two times a day (BID) | ORAL | 0 refills | Status: AC
Start: 2023-07-14 — End: 2023-07-24

## 2023-07-14 NOTE — Progress Notes (Signed)
[facility] [address] [place] [address] Dept: [phone] Dept Fax: [phone]  Acute Care Office Visit  Subjective:   Rose [name] 07-18-1981 07/14/2023  Chief Complaint  Patient presents with   Sore Throat    Started yesterday  Difficulty swallowing  Nasal drainage     HPI: Rose [name] is a 42 yo F who complains of sore throat onset 1 day ago. Hx of tonsillectomy. Has continued to worsen.  Fever: no Headache: yes - onset today  Runny nose: yes Nasal congestion: no  Sinus pressure: no Post nasal drip: no Cough: no Ear pain: no Sore throat: yes  Treatments tried: prior rx at home aug/clav 1 dose, medicine ball tea from [employer]  Recent sick contacts: yes - children at home.  Patient has terminally ill immunocompromised daughter at home.     The following portions of the patient's history were reviewed and updated as appropriate: past medical history, past surgical history, family history, social history, allergies, medications, and problem list.   Patient Active Problem List   Diagnosis Date Noted   Dizziness 06/09/2023   S/P laparoscopic hysterectomy 06/23/2022   Blister of lip 06/16/2022   Symptomatic anemia 05/29/2022   Menorrhagia 05/29/2022   CRP elevated 01/05/2022   Mitral valve prolapse 01/05/2022   Family history of Rett syndrome 12/26/2017   Family history of carrier of genetic disease 10/09/2013   Chronic back pain 01/12/2010   PALPITATIONS 01/12/2010   Past Medical History:  Diagnosis Date   Degenerative disc disease, lumbar    Palpitations    Subchorionic hemorrhage 07/17/2012   Past Surgical History:  Procedure Laterality Date   ABDOMINAL HYSTERECTOMY     BACK SURGERY     x 2   BREAST SURGERY     CESAREAN SECTION N/A 01/29/2013   Procedure: Primary cesarean section with delivery of baby ;  Surgeon: Oliver Pila, MD;  Location: [facility] ORS;  Service: Obstetrics;   Laterality: N/A;  Primary   CYSTOSCOPY  06/23/2022   Procedure: CYSTOSCOPY;  Surgeon: Huel Cote, MD;  Location[facility] OR;  Service: Gynecology;;   DILATION AND CURETTAGE OF UTERUS     x2   TONSILLECTOMY     TOTAL LAPAROSCOPIC HYSTERECTOMY WITH SALPINGECTOMY Bilateral 06/23/2022   Procedure: TOTAL LAPAROSCOPIC HYSTERECTOMY WITH BILATERAL SALPINGECTOMY;  Surgeon: Huel Cote, MD;  Location: [facility] OR;  Service: Gynecology;  Laterality: Bilateral;   Family History  Problem Relation Age of Onset   Thyroid disease Mother    Heart disease Father    Cancer Father        throat   Other Daughter        Rett syndrome    Cancer Maternal Grandmother        breast    Current Outpatient Medications:    amoxicillin-clavulanate (AUGMENTIN) 875-125 MG tablet, Take 1 tablet by mouth 2 (two) times daily for 10 days., Disp: 20 tablet, Rfl: 0   acetaminophen (TYLENOL) 500 MG tablet, Take 2 tablets (1,000 mg total) by mouth every 6 (six) hours. (Patient not taking: Reported on 06/09/2023), Disp: 30 tablet, Rfl: 0   ibuprofen (ADVIL) 600 MG tablet, Take 1 tablet (600 mg total) by mouth every 6 (six) hours. (Patient not taking: Reported on 06/09/2023), Disp: 30 tablet, Rfl: 0   meclizine (ANTIVERT) 12.5 MG tablet, Take 1-2 tablets (12.5-25 mg total) by mouth 3 (three) times daily as needed for dizziness. (Patient not taking: Reported on 07/14/2023), Disp: 30  tablet, Rfl: 0   naproxen (NAPROSYN) 375 MG tablet, Take 1 tablet (375 mg total) by mouth 2 (two) times daily. (Patient not taking: Reported on 06/09/2023), Disp: 14 tablet, Rfl: 0   ondansetron (ZOFRAN) 4 MG tablet, Take 1 tablet (4 mg total) by mouth every 6 (six) hours as needed for nausea. (Patient not taking: Reported on 06/09/2023), Disp: 20 tablet, Rfl: 0   oxyCODONE (OXY IR/ROXICODONE) 5 MG immediate release tablet, Take 1-2 tablets (5-10 mg total) by mouth every 4 (four) hours as needed for moderate pain. (Patient not taking: Reported on 06/09/2023),  Disp: 30 tablet, Rfl: 0   valACYclovir (VALTREX) 1000 MG tablet, Take 1,000 mg by mouth daily. (Patient not taking: Reported on 06/09/2023), Disp: , Rfl:  Allergies  Allergen Reactions   Codeine Nausea And Vomiting    Pt does not recall if she has taken percocet or vicodin.  Discharge summary from last pregnancy says she was given prescription for ibuprofen and percocet.   Tape Dermatitis    Severe skin burn from adhesive tape     ROS: A complete ROS was performed with pertinent positives/negatives noted in the HPI. The remainder of the ROS are negative.    Objective:   Today's Vitals   07/14/23 1548  BP: 96/68  Pulse: 87  Temp: 98.4 F (36.9 C)  TempSrc: Temporal  SpO2: 96%  Weight: 123 lb 6.4 oz (56 kg)  Height: 5\' 7"  (1.702 m)    GENERAL: Well-appearing, in NAD. Well nourished.  SKIN: Pink, warm and dry. No rash, lesion, ulceration, or ecchymoses.  HEENT:    HEAD: Normocephalic, non-traumatic.  EYES: Conjunctive pink without exudate. PERRL, EOMI.  EARS: External ear w/o redness, swelling, masses, or lesions. EAC clear. TM's intact, translucent w/o bulging, appropriate landmarks visualized.  NOSE: Septum midline w/o deformity. Nares patent, mucosa pink and non-inflamed w/o drainage. No sinus tenderness.  THROAT: Uvula midline. Oropharynx erythematous. Tonsils absent Mucus membranes pink and moist.  NECK: Trachea midline. Full ROM w/o pain or tenderness. No lymphadenopathy.  RESPIRATORY: Chest wall symmetrical. Respirations even and non-labored. Breath sounds clear to auscultation bilaterally.  CARDIAC: S1, S2 present, regular rate and rhythm. Peripheral pulses 2+ bilaterally.  EXTREMITIES: Without clubbing, cyanosis, or edema.  NEUROLOGIC: No motor or sensory deficits. Steady, even gait.  PSYCH/MENTAL STATUS: Alert, oriented x 3. Cooperative, appropriate mood and affect.    Results for orders placed or performed in visit on 07/14/23  POC COVID-19 BinaxNow  Result Value  Ref Range   SARS Coronavirus 2 Ag Negative Negative  POCT Influenza A/B  Result Value Ref Range   Influenza A, POC Negative Negative   Influenza B, POC Negative Negative  POCT rapid strep A  Result Value Ref Range   Rapid Strep A Screen Negative Negative      Assessment & Plan:  1. Upper respiratory tract infection, unspecified type - POC COVID-19 BinaxNow - POCT Influenza A/B - POCT rapid strep A - amoxicillin-clavulanate (AUGMENTIN) 875-125 MG tablet; Take 1 tablet by mouth 2 (two) times daily for 10 days.  Dispense: 20 tablet; Refill: 0 - will cover with ABX d/t having immunocompromised daughter at home.  - recommended patient repeat COVID test at home over the next 2-3 days, call PCP if positive.   Meds ordered this encounter  Medications   amoxicillin-clavulanate (AUGMENTIN) 875-125 MG tablet    Sig: Take 1 tablet by mouth 2 (two) times daily for 10 days.    Dispense:  20 tablet  Refill:  0    Order Specific Question:   Supervising Provider    Answer:   Garnette Gunner [[id number]]   Orders Placed This Encounter  Procedures   POC COVID-19 BinaxNow    Order Specific Question:   Previously tested for COVID-19    Answer:   Yes    Order Specific Question:   Resident in a congregate (group) care setting    Answer:   No    Order Specific Question:   Employed in healthcare setting    Answer:   Unknown    Order Specific Question:   Pregnant    Answer:   No   POCT Influenza A/B   POCT rapid strep A   Lab Orders         POC COVID-19 BinaxNow         POCT Influenza A/B         POCT rapid strep A     No images are attached to the encounter or orders placed in the encounter.  Return if symptoms worsen or fail to improve.   Salvatore Decent, FNP

## 2023-07-14 NOTE — Patient Instructions (Addendum)
Continue to repeat COVID tests for the next few days , call if positive.   Rest, drink plenty of fluids.  Tylenol for fever, body aches.  Salt water gargles for sore throat  OTC chloraseptic spray   Vitamin C, Zinc (cold eeze lozenges), and elderberry syrup

## 2023-07-14 NOTE — Telephone Encounter (Signed)
I called and spoke with patient and scheduled patient for a 3:40pm appointment to see Salvatore Decent.

## 2023-07-20 DIAGNOSIS — R42 Dizziness and giddiness: Secondary | ICD-10-CM | POA: Diagnosis not present

## 2023-07-20 DIAGNOSIS — M545 Low back pain, unspecified: Secondary | ICD-10-CM | POA: Diagnosis not present

## 2023-07-20 DIAGNOSIS — M542 Cervicalgia: Secondary | ICD-10-CM | POA: Diagnosis not present

## 2023-08-09 DIAGNOSIS — M545 Low back pain, unspecified: Secondary | ICD-10-CM | POA: Diagnosis not present

## 2023-08-09 DIAGNOSIS — M542 Cervicalgia: Secondary | ICD-10-CM | POA: Diagnosis not present

## 2023-08-09 DIAGNOSIS — R42 Dizziness and giddiness: Secondary | ICD-10-CM | POA: Diagnosis not present

## 2023-08-23 DIAGNOSIS — M542 Cervicalgia: Secondary | ICD-10-CM | POA: Diagnosis not present

## 2023-08-23 DIAGNOSIS — M545 Low back pain, unspecified: Secondary | ICD-10-CM | POA: Diagnosis not present

## 2023-08-23 DIAGNOSIS — R42 Dizziness and giddiness: Secondary | ICD-10-CM | POA: Diagnosis not present

## 2023-08-31 DIAGNOSIS — N393 Stress incontinence (female) (male): Secondary | ICD-10-CM | POA: Diagnosis not present

## 2023-08-31 DIAGNOSIS — N9419 Other specified dyspareunia: Secondary | ICD-10-CM | POA: Diagnosis not present

## 2023-09-09 DIAGNOSIS — H2513 Age-related nuclear cataract, bilateral: Secondary | ICD-10-CM | POA: Diagnosis not present

## 2023-09-09 DIAGNOSIS — H31091 Other chorioretinal scars, right eye: Secondary | ICD-10-CM | POA: Diagnosis not present

## 2023-09-09 DIAGNOSIS — H43823 Vitreomacular adhesion, bilateral: Secondary | ICD-10-CM | POA: Diagnosis not present

## 2023-09-13 DIAGNOSIS — M6289 Other specified disorders of muscle: Secondary | ICD-10-CM | POA: Diagnosis not present

## 2023-09-13 DIAGNOSIS — N94819 Vulvodynia, unspecified: Secondary | ICD-10-CM | POA: Diagnosis not present

## 2023-09-13 DIAGNOSIS — N9419 Other specified dyspareunia: Secondary | ICD-10-CM | POA: Diagnosis not present

## 2023-09-27 DIAGNOSIS — M545 Low back pain, unspecified: Secondary | ICD-10-CM | POA: Diagnosis not present

## 2023-09-27 DIAGNOSIS — M542 Cervicalgia: Secondary | ICD-10-CM | POA: Diagnosis not present

## 2023-09-27 DIAGNOSIS — R42 Dizziness and giddiness: Secondary | ICD-10-CM | POA: Diagnosis not present

## 2023-10-11 DIAGNOSIS — R42 Dizziness and giddiness: Secondary | ICD-10-CM | POA: Diagnosis not present

## 2023-10-11 DIAGNOSIS — M545 Low back pain, unspecified: Secondary | ICD-10-CM | POA: Diagnosis not present

## 2023-10-11 DIAGNOSIS — M542 Cervicalgia: Secondary | ICD-10-CM | POA: Diagnosis not present

## 2023-10-19 DIAGNOSIS — M542 Cervicalgia: Secondary | ICD-10-CM | POA: Diagnosis not present

## 2023-10-19 DIAGNOSIS — M545 Low back pain, unspecified: Secondary | ICD-10-CM | POA: Diagnosis not present

## 2023-10-19 DIAGNOSIS — R42 Dizziness and giddiness: Secondary | ICD-10-CM | POA: Diagnosis not present

## 2023-11-09 DIAGNOSIS — R42 Dizziness and giddiness: Secondary | ICD-10-CM | POA: Diagnosis not present

## 2023-11-09 DIAGNOSIS — M542 Cervicalgia: Secondary | ICD-10-CM | POA: Diagnosis not present

## 2023-11-09 DIAGNOSIS — M545 Low back pain, unspecified: Secondary | ICD-10-CM | POA: Diagnosis not present

## 2023-11-15 DIAGNOSIS — G2581 Restless legs syndrome: Secondary | ICD-10-CM | POA: Diagnosis not present

## 2023-11-15 DIAGNOSIS — R600 Localized edema: Secondary | ICD-10-CM | POA: Diagnosis not present

## 2023-11-15 DIAGNOSIS — I83892 Varicose veins of left lower extremities with other complications: Secondary | ICD-10-CM | POA: Diagnosis not present

## 2023-11-15 DIAGNOSIS — N9419 Other specified dyspareunia: Secondary | ICD-10-CM | POA: Diagnosis not present

## 2023-11-15 DIAGNOSIS — I872 Venous insufficiency (chronic) (peripheral): Secondary | ICD-10-CM | POA: Diagnosis not present

## 2023-11-16 DIAGNOSIS — I83892 Varicose veins of left lower extremities with other complications: Secondary | ICD-10-CM | POA: Diagnosis not present

## 2023-11-22 DIAGNOSIS — M542 Cervicalgia: Secondary | ICD-10-CM | POA: Diagnosis not present

## 2023-11-22 DIAGNOSIS — M545 Low back pain, unspecified: Secondary | ICD-10-CM | POA: Diagnosis not present

## 2023-11-23 DIAGNOSIS — R5383 Other fatigue: Secondary | ICD-10-CM | POA: Diagnosis not present

## 2023-11-23 DIAGNOSIS — E611 Iron deficiency: Secondary | ICD-10-CM | POA: Diagnosis not present

## 2023-11-23 DIAGNOSIS — E559 Vitamin D deficiency, unspecified: Secondary | ICD-10-CM | POA: Diagnosis not present

## 2023-11-23 DIAGNOSIS — E612 Magnesium deficiency: Secondary | ICD-10-CM | POA: Diagnosis not present

## 2023-11-23 DIAGNOSIS — N939 Abnormal uterine and vaginal bleeding, unspecified: Secondary | ICD-10-CM | POA: Diagnosis not present

## 2023-11-23 DIAGNOSIS — Z1329 Encounter for screening for other suspected endocrine disorder: Secondary | ICD-10-CM | POA: Diagnosis not present

## 2023-11-23 DIAGNOSIS — E538 Deficiency of other specified B group vitamins: Secondary | ICD-10-CM | POA: Diagnosis not present

## 2023-11-23 DIAGNOSIS — Z1321 Encounter for screening for nutritional disorder: Secondary | ICD-10-CM | POA: Diagnosis not present

## 2023-12-06 DIAGNOSIS — M545 Low back pain, unspecified: Secondary | ICD-10-CM | POA: Diagnosis not present

## 2023-12-06 DIAGNOSIS — M542 Cervicalgia: Secondary | ICD-10-CM | POA: Diagnosis not present

## 2023-12-20 DIAGNOSIS — M542 Cervicalgia: Secondary | ICD-10-CM | POA: Diagnosis not present

## 2023-12-20 DIAGNOSIS — M545 Low back pain, unspecified: Secondary | ICD-10-CM | POA: Diagnosis not present

## 2023-12-23 DIAGNOSIS — E559 Vitamin D deficiency, unspecified: Secondary | ICD-10-CM | POA: Diagnosis not present

## 2023-12-23 DIAGNOSIS — E039 Hypothyroidism, unspecified: Secondary | ICD-10-CM | POA: Diagnosis not present

## 2023-12-23 DIAGNOSIS — R739 Hyperglycemia, unspecified: Secondary | ICD-10-CM | POA: Diagnosis not present

## 2023-12-23 DIAGNOSIS — Z13220 Encounter for screening for lipoid disorders: Secondary | ICD-10-CM | POA: Diagnosis not present

## 2023-12-27 DIAGNOSIS — M545 Low back pain, unspecified: Secondary | ICD-10-CM | POA: Diagnosis not present

## 2023-12-27 DIAGNOSIS — M542 Cervicalgia: Secondary | ICD-10-CM | POA: Diagnosis not present

## 2024-01-04 DIAGNOSIS — M542 Cervicalgia: Secondary | ICD-10-CM | POA: Diagnosis not present

## 2024-01-04 DIAGNOSIS — M545 Low back pain, unspecified: Secondary | ICD-10-CM | POA: Diagnosis not present

## 2024-01-05 DIAGNOSIS — Z13220 Encounter for screening for lipoid disorders: Secondary | ICD-10-CM | POA: Diagnosis not present

## 2024-01-05 DIAGNOSIS — M810 Age-related osteoporosis without current pathological fracture: Secondary | ICD-10-CM | POA: Diagnosis not present

## 2024-01-05 DIAGNOSIS — E559 Vitamin D deficiency, unspecified: Secondary | ICD-10-CM | POA: Diagnosis not present

## 2024-01-05 DIAGNOSIS — E7211 Homocystinuria: Secondary | ICD-10-CM | POA: Diagnosis not present

## 2024-01-05 DIAGNOSIS — E039 Hypothyroidism, unspecified: Secondary | ICD-10-CM | POA: Diagnosis not present

## 2024-01-13 DIAGNOSIS — L820 Inflamed seborrheic keratosis: Secondary | ICD-10-CM | POA: Diagnosis not present

## 2024-01-13 DIAGNOSIS — Z85828 Personal history of other malignant neoplasm of skin: Secondary | ICD-10-CM | POA: Diagnosis not present

## 2024-01-13 DIAGNOSIS — L718 Other rosacea: Secondary | ICD-10-CM | POA: Diagnosis not present

## 2024-01-13 DIAGNOSIS — L570 Actinic keratosis: Secondary | ICD-10-CM | POA: Diagnosis not present

## 2024-01-17 DIAGNOSIS — M542 Cervicalgia: Secondary | ICD-10-CM | POA: Diagnosis not present

## 2024-01-17 DIAGNOSIS — M545 Low back pain, unspecified: Secondary | ICD-10-CM | POA: Diagnosis not present

## 2024-01-27 DIAGNOSIS — M2559 Pain in other specified joint: Secondary | ICD-10-CM | POA: Diagnosis not present

## 2024-01-27 DIAGNOSIS — U099 Post covid-19 condition, unspecified: Secondary | ICD-10-CM | POA: Diagnosis not present

## 2024-01-27 DIAGNOSIS — B960 Mycoplasma pneumoniae [M. pneumoniae] as the cause of diseases classified elsewhere: Secondary | ICD-10-CM | POA: Diagnosis not present

## 2024-01-27 DIAGNOSIS — J160 Chlamydial pneumonia: Secondary | ICD-10-CM | POA: Diagnosis not present

## 2024-02-29 DIAGNOSIS — Z1239 Encounter for other screening for malignant neoplasm of breast: Secondary | ICD-10-CM | POA: Diagnosis not present

## 2024-02-29 DIAGNOSIS — Z139 Encounter for screening, unspecified: Secondary | ICD-10-CM | POA: Diagnosis not present

## 2024-02-29 DIAGNOSIS — Z129 Encounter for screening for malignant neoplasm, site unspecified: Secondary | ICD-10-CM | POA: Diagnosis not present

## 2024-03-07 ENCOUNTER — Ambulatory Visit: Payer: Self-pay

## 2024-03-07 NOTE — Telephone Encounter (Signed)
 Chief Complaint: leg pain Symptoms: pain behind her R knee  Frequency: 3-4 days  Pertinent Negatives: Patient denies redness, swelling, heat, numbness, tingling, knot under the skin, CP, SOB, palpitations Disposition: [] ED /[x] Urgent Care (no appt availability in office) / [] Appointment(In office/virtual)/ []  [facility]/ [] Home Care/ [x] Refused Recommended Disposition /[] [facility]/ []  Follow-up with PCP Additional Notes: Pt reports pain behind her R knee for 3-4 days that is worse with flexion and extension. Pt denies recent injury other than standing on her feet often at work. 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 Pt denies redness, swelling, heat, numbness, tingling, a knot under the skin, palpitations, SOB, CP, fever. Pt states that she does not believe her symptoms are muscular. Pt states "this feels like vein tenderness." Given one-sided pain and hx of varicose veins, RN advised pt she should be seen within 4 hours at an UC. Pt declined, stating she has her kids with her. Pt states that in the past she has been given a referral for an US . RN advised pt RN would relay symptoms and inquiry about a referral to the office for follow-up. Pt advised RN she will go to the ED if her symptoms worsen or change.    Copied from CRM [id number]. Topic: Clinical - Medical Advice >> Mar 07, 2024  3:49 PM Concetta Dee E wrote: Reason for CRM: Pt has blood clots in the back of leg have been bothering for a few days. behind leg, referral for scan in the back of leg, ultrasound   Please contact pt back @ [phone] Reason for Disposition  [1] Thigh, calf, or ankle swelling AND [2] only 1 side  Answer Assessment - Initial Assessment Questions 1. ONSET: "When did the pain start?"      3-4 days ago 2. LOCATION: "Where is the pain located?"      R leg behind the knee 3. PAIN: "How bad is the pain?"    (Scale 1-10; or mild, moderate, severe)   -  MILD (1-3):  doesn't interfere with normal activities    -  MODERATE (4-7): interferes with normal activities (e.g., work or school) or awakens from sleep, limping    -  SEVERE (8-10): excruciating pain, unable to do any normal activities, unable to walk     "It's not excruciating", 4/10; pain is constant with certain movements (flexing, stairs), but is not constant 4. WORK OR EXERCISE: "Has there been any recent work or exercise that involved this part of the body?"      On her feet at work  5. CAUSE: "What do you think is causing the leg pain?"     Not sure  6. OTHER SYMPTOMS: "Do you have any other symptoms?" (e.g., chest pain, back pain, breathing difficulty, swelling, rash, fever, numbness, weakness)     Pain/pressure behind the knee with flexion and extension. Denies redness. Denies swelling. Denies heat. Denies knot under the skin. Denies pain or swelling to calf. Denies numbness and tingling.  "Feels like vein tenderness", "my veins are always more prominent, more so in the L leg than the right."  Denies CP or SOB. Denies palpitations  Protocols used: Leg Pain-A-AH

## 2024-03-08 NOTE — Telephone Encounter (Signed)
 Left message for patient to return call so that we can make her an appointment to be seen.

## 2024-03-08 NOTE — Telephone Encounter (Signed)
 Left a detailed message for patient to return call and schedule an appointment. I will also send patient a message via Mychart.

## 2024-03-08 NOTE — Telephone Encounter (Signed)
 Left detailed for patient to call the office and schedule an appointment.

## 2024-03-13 ENCOUNTER — Ambulatory Visit: Admitting: Family Medicine

## 2024-03-13 ENCOUNTER — Encounter: Payer: Self-pay | Admitting: Family Medicine

## 2024-03-13 VITALS — BP 98/64 | HR 65 | Temp 97.6°F | Ht 67.0 in | Wt 121.8 lb

## 2024-03-13 DIAGNOSIS — Z130 Encounter for screening for diseases of the blood and blood-forming organs and certain disorders involving the immune mechanism: Secondary | ICD-10-CM | POA: Diagnosis not present

## 2024-03-13 DIAGNOSIS — M79661 Pain in right lower leg: Secondary | ICD-10-CM | POA: Diagnosis not present

## 2024-03-13 DIAGNOSIS — Z01419 Encounter for gynecological examination (general) (routine) without abnormal findings: Secondary | ICD-10-CM | POA: Diagnosis not present

## 2024-03-13 NOTE — Progress Notes (Signed)
 [facility]  Established Patient Office Visit   Subjective:  Patient ID: Rose [name], female    DOB: [birth year]  Age: 43 y.o. MRN: [id number]  Chief Complaint  Patient presents with   Leg Pain    Pt states she has been having pain in the back of her leg behind her knee x2 weeks. Pt states pain is aggravated by standing and going up the stairs.     Leg Pain  Pertinent negatives include no tingling.   Encounter Diagnoses  Name Primary?   Right calf pain Yes   2-week history of discomfort in the back of her right knee and upper calf area.  There has been no injury.  Denies locking or giving way of her knee.  History of multiple varicosities associated with 2 pregnancies.  She has no history of DVT.  No known family history of DVT.  Lingering paresthesias in her left foot after multiple back surgeries.  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   Review of Systems  Constitutional: Negative.   HENT: Negative.    Eyes:  Negative for blurred vision, discharge and redness.  Respiratory: Negative.  Negative for shortness of breath.   Cardiovascular: Negative.  Negative for chest pain.  Gastrointestinal:  Negative for abdominal pain.  Genitourinary: Negative.   Musculoskeletal: Negative.  Negative for myalgias.  Skin:  Negative for rash.  Neurological:  Negative for tingling, loss of consciousness and weakness.  Endo/Heme/Allergies:  Negative for polydipsia.     Current Outpatient Medications:    acetaminophen  (TYLENOL ) 500 MG tablet, Take 2 tablets (1,000 mg total) by mouth every 6 (six) hours. (Patient not taking: Reported on 03/13/2024), Disp: 30 tablet, Rfl: 0   ibuprofen  (ADVIL ) 600 MG tablet, Take 1 tablet (600 mg total) by mouth every 6 (six) hours. (Patient not taking: Reported on 03/13/2024), Disp: 30 tablet, Rfl: 0   meclizine  (ANTIVERT ) 12.5 MG tablet, Take 1-2 tablets (12.5-25 mg total) by mouth 3 (three) times daily as needed for dizziness. (Patient not  taking: Reported on 03/13/2024), Disp: 30 tablet, Rfl: 0   naproxen  (NAPROSYN ) 375 MG tablet, Take 1 tablet (375 mg total) by mouth 2 (two) times daily. (Patient not taking: Reported on 03/13/2024), Disp: 14 tablet, Rfl: 0   ondansetron  (ZOFRAN ) 4 MG tablet, Take 1 tablet (4 mg total) by mouth every 6 (six) hours as needed for nausea. (Patient not taking: Reported on 03/13/2024), Disp: 20 tablet, Rfl: 0   oxyCODONE  (OXY IR/ROXICODONE ) 5 MG immediate release tablet, Take 1-2 tablets (5-10 mg total) by mouth every 4 (four) hours as needed for moderate pain. (Patient not taking: Reported on 03/13/2024), Disp: 30 tablet, Rfl: 0   valACYclovir  (VALTREX ) 1000 MG tablet, Take 1,000 mg by mouth daily. (Patient not taking: Reported on 06/09/2023), Disp: , Rfl:    Objective:     BP 98/64 (Cuff Size: Normal)   Pulse 65   Temp 97.6 F (36.4 C) (Temporal)   Ht 5\' 7"  (1.702 m)   Wt 121 lb 12.8 oz (55.2 kg)   LMP  (LMP Unknown) Comment: Pt bled from 7/10-8/4  SpO2 98%   BMI 19.08 kg/m    Physical Exam Constitutional:      General: She is not in acute distress.    Appearance: Normal appearance. She is not ill-appearing, toxic-appearing or diaphoretic.  HENT:     Head: Normocephalic and atraumatic.     Right Ear: External ear normal.  Left Ear: External ear normal.  Eyes:     General: No scleral icterus.       Right eye: No discharge.        Left eye: No discharge.     Extraocular Movements: Extraocular movements intact.     Conjunctiva/sclera: Conjunctivae normal.  Pulmonary:     Effort: Pulmonary effort is normal. No respiratory distress.  Musculoskeletal:     Right knee: Swelling present. No deformity or bony tenderness. Normal range of motion. No tenderness.       Legs:  Skin:    General: Skin is warm and dry.  Neurological:     Mental Status: She is alert and oriented to person, place, and time.  Psychiatric:        Mood and Affect: Mood normal.        Behavior: Behavior normal.       No results found for any visits on 03/13/24.    The 10-year ASCVD risk score (Arnett DK, et al., 2019) is: 0.2%    Assessment & Plan:   Right calf pain -     D-dimer, quantitative -     VAS US  LOWER EXTREMITY VENOUS (DVT); Future    Return Schedule follow-up with Lauren in a few weeks..  Will check D-dimer and venous ultrasound of right leg.  Asked for automatic referral to the DVT clinic if positive.   Also would consider the possibility of a Baker's cyst.    Tonna Frederic, MD

## 2024-03-14 ENCOUNTER — Ambulatory Visit (HOSPITAL_COMMUNITY)
Admission: RE | Admit: 2024-03-14 | Discharge: 2024-03-14 | Disposition: A | Discharge status: Home / Self Care | Source: Ambulatory Visit | Attending: Family Medicine | Admitting: Family Medicine

## 2024-03-14 ENCOUNTER — Telehealth: Payer: Self-pay

## 2024-03-14 DIAGNOSIS — M79661 Pain in right lower leg: Secondary | ICD-10-CM | POA: Insufficient documentation

## 2024-03-14 LAB — D-DIMER, QUANTITATIVE: D-Dimer, Quant: 0.58 ug{FEU}/mL — ABNORMAL HIGH (ref <0.50)

## 2024-03-14 NOTE — Telephone Encounter (Signed)
 Call report given to [facility].   Results given to pt. Pt states she will wait a few days to see how her legs feels, if persists weill call to schedule.

## 2024-03-15 ENCOUNTER — Ambulatory Visit: Payer: Self-pay | Admitting: Family Medicine

## 2024-03-15 ENCOUNTER — Ambulatory Visit (HOSPITAL_COMMUNITY)

## 2024-04-03 DIAGNOSIS — Z85828 Personal history of other malignant neoplasm of skin: Secondary | ICD-10-CM | POA: Diagnosis not present

## 2024-04-03 DIAGNOSIS — D2261 Melanocytic nevi of right upper limb, including shoulder: Secondary | ICD-10-CM | POA: Diagnosis not present

## 2024-04-03 DIAGNOSIS — D2262 Melanocytic nevi of left upper limb, including shoulder: Secondary | ICD-10-CM | POA: Diagnosis not present

## 2024-04-03 DIAGNOSIS — D225 Melanocytic nevi of trunk: Secondary | ICD-10-CM | POA: Diagnosis not present

## 2024-06-12 DIAGNOSIS — E559 Vitamin D deficiency, unspecified: Secondary | ICD-10-CM | POA: Diagnosis not present

## 2024-06-12 DIAGNOSIS — E281 Androgen excess: Secondary | ICD-10-CM | POA: Diagnosis not present

## 2024-06-12 DIAGNOSIS — E60 Dietary zinc deficiency: Secondary | ICD-10-CM | POA: Diagnosis not present

## 2024-06-12 DIAGNOSIS — M545 Low back pain, unspecified: Secondary | ICD-10-CM | POA: Diagnosis not present

## 2024-06-12 DIAGNOSIS — M859 Disorder of bone density and structure, unspecified: Secondary | ICD-10-CM | POA: Diagnosis not present

## 2024-10-14 ENCOUNTER — Ambulatory Visit
Admission: RE | Admit: 2024-10-14 | Discharge: 2024-10-14 | Disposition: A | Discharge status: Home / Self Care | Source: Home / Self Care

## 2024-10-14 ENCOUNTER — Ambulatory Visit: Admitting: Radiology

## 2024-10-14 ENCOUNTER — Other Ambulatory Visit: Payer: Self-pay

## 2024-10-14 VITALS — BP 104/68 | HR 79 | Temp 98.0°F | Resp 16 | Ht 67.0 in | Wt 120.0 lb

## 2024-10-14 DIAGNOSIS — R079 Chest pain, unspecified: Secondary | ICD-10-CM | POA: Diagnosis not present

## 2024-10-14 DIAGNOSIS — J9801 Acute bronchospasm: Secondary | ICD-10-CM

## 2024-10-14 DIAGNOSIS — R058 Other specified cough: Secondary | ICD-10-CM

## 2024-10-14 DIAGNOSIS — R051 Acute cough: Secondary | ICD-10-CM

## 2024-10-14 DIAGNOSIS — R059 Cough, unspecified: Secondary | ICD-10-CM | POA: Diagnosis not present

## 2024-10-14 MED ORDER — PREDNISONE 20 MG PO TABS: 40.0000 mg | ORAL_TABLET | Freq: Every day | ORAL | 0 refills | Status: AC

## 2024-10-14 NOTE — Discharge Instructions (Addendum)
 VISIT SUMMARY:  You came in today with a persistent cough and recent onset of chest pain. You suspect you got sick after attending a wedding. Your cough has been slow to resolve and you have developed a throbbing chest pain that worsens with activity. 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 We performed a chest x-ray and an EKG, both of which showed no concerning signs.  YOUR PLAN:  -POST-VIRAL COUGH SYNDROME: Post-viral cough syndrome is a persistent cough that follows a viral illness. Your cough is likely due to inflammation in the intercostal spaces. We have prescribed prednisone  40 mg once daily for 5 days to reduce inflammation and help alleviate your cough. Please seek emergency care if you experience severe pain not relieved by Tylenol  or ibuprofen , significant trouble breathing, or loss of consciousness.  INSTRUCTIONS:  Please follow the prescribed course of prednisone  and monitor your symptoms. If you experience severe pain, significant trouble breathing, or loss of consciousness, seek emergency care immediately.

## 2024-10-14 NOTE — ED Triage Notes (Signed)
 Pt presents to urgent care for chest x-ray to rule out pneumonia. States she has been experiencing a dry, productive cough for about three weeks. Has been more fatigued recently. Reports left-sided chest pain that began three days ago. Per pt description, pain is constant and a throbbing-sensation. Last time she experienced these sx, dx with pneumonia. No OTC medications taken recently for symptoms. Endorses some SOB.

## 2024-10-14 NOTE — ED Provider Notes (Signed)
 [facility]    CSN: [id number] Arrival date & time: 10/14/24  9176      History   Chief Complaint Chief Complaint  Patient presents with   Cough    Had a bad cough since thanksgiving and having some chest pain want to do a chest xray bc I have had pneumonia before - Entered by patient   Fatigue    HPI Rose [name] is a 43 y.o. female.  has a past medical history of Degenerative disc disease, lumbar, Palpitations, and Subchorionic hemorrhage (07/17/2012).   HPI  Discussed the use of AI scribe software for clinical note transcription with the patient, who gave verbal consent to proceed.  The patient, with tachycardia and mitral valve issues, presents with persistent cough and recent onset chest pain.  The patient became ill the Monday before Thanksgiving after attending a large wedding, suspecting this as the source of their illness. They have experienced a persistent cough that has been slow to resolve. The cough is described as dry but sometimes sounds wet, with no expectoration despite frequent coughing.  In the past few days, they have developed chest pain and increased fatigue. The chest pain is described as a throbbing sensation located mainly near the heart, worsening with activity and subsiding with rest. The pain is not reproduced by pressing on the area. No fevers or chills are reported, and they feel mostly fine aside from the fatigue and chest pain. Initially, they had a sore throat at the onset of the illness, but this has resolved.  They have a history of tachycardia and mitral valve issues, which have flared up during past pregnancies. They have had pneumonia in the past, approximately eight years ago, and are concerned about the possibility of recurrence due to their current symptoms.  They mention having a special needs child and want to be proactive in managing their health to avoid being overwhelmed during upcoming busy weeks. They have had a  hysterectomy, ruling out pregnancy as a cause for any symptoms.    Past Medical History:  Diagnosis Date   Degenerative disc disease, lumbar    Palpitations    Subchorionic hemorrhage 07/17/2012    Patient Active Problem List   Diagnosis Date Noted   Dizziness 06/09/2023   S/P laparoscopic hysterectomy 06/23/2022   Blister of lip 06/16/2022   Symptomatic anemia 05/29/2022   Menorrhagia 05/29/2022   CRP elevated 01/05/2022   Mitral valve prolapse 01/05/2022   Family history of Rett syndrome 12/26/2017   Family history of carrier of genetic disease 10/09/2013   Chronic back pain 01/12/2010   PALPITATIONS 01/12/2010    Past Surgical History:  Procedure Laterality Date   ABDOMINAL HYSTERECTOMY     BACK SURGERY     x 2   BREAST SURGERY     CESAREAN SECTION N/A 01/29/2013   Procedure: Primary cesarean section with delivery of baby ;  Surgeon: Nathanel LELON Bunker, MD;  Location: [facility];  Service: Obstetrics;  Laterality: N/A;  Primary   CYSTOSCOPY  06/23/2022   Procedure: CYSTOSCOPY;  Surgeon: Bunker Nathanel, MD;  Location: [facility] OR;  Service: Gynecology;;   DILATION AND CURETTAGE OF UTERUS     x2   TONSILLECTOMY     TOTAL LAPAROSCOPIC HYSTERECTOMY WITH SALPINGECTOMY Bilateral 06/23/2022   Procedure: TOTAL LAPAROSCOPIC HYSTERECTOMY WITH BILATERAL SALPINGECTOMY;  Surgeon: Bunker Nathanel, MD;  Location: [facility] OR;  Service: Gynecology;  Laterality: Bilateral;    OB History     Gravida  5  Para  2   Term  2   Preterm      AB  2   Living  2      SAB  2   IAB      Ectopic      Multiple      Live Births  2            Home Medications    Prior to Admission medications  Medication Sig Start Date End Date Taking? Authorizing Provider  acetaminophen  (TYLENOL ) 500 MG tablet Take 2 tablets (1,000 mg total) by mouth every 6 (six) hours. Patient not taking: Reported on 03/13/2024 06/24/22   Estelle Service, MD  ibuprofen  (ADVIL ) 600 MG tablet Take 1 tablet  (600 mg total) by mouth every 6 (six) hours. Patient not taking: Reported on 03/13/2024 06/24/22   Estelle Service, MD  meclizine  (ANTIVERT ) 12.5 MG tablet Take 1-2 tablets (12.5-25 mg total) by mouth 3 (three) times daily as needed for dizziness. Patient not taking: Reported on 03/13/2024 06/09/23   Nedra Tinnie LABOR, NP  naproxen  (NAPROSYN ) 375 MG tablet Take 1 tablet (375 mg total) by mouth 2 (two) times daily. Patient not taking: Reported on 03/13/2024 10/17/22   Palumbo, April, MD  ondansetron  (ZOFRAN ) 4 MG tablet Take 1 tablet (4 mg total) by mouth every 6 (six) hours as needed for nausea. Patient not taking: Reported on 03/13/2024 06/24/22   Estelle Service, MD  oxyCODONE  (OXY IR/ROXICODONE ) 5 MG immediate release tablet Take 1-2 tablets (5-10 mg total) by mouth every 4 (four) hours as needed for moderate pain. Patient not taking: Reported on 03/13/2024 06/24/22   Estelle Service, MD  valACYclovir  (VALTREX ) 1000 MG tablet Take 1,000 mg by mouth daily. Patient not taking: Reported on 06/09/2023    [provider]    Family History Family History  Problem Relation Age of Onset   Thyroid  disease Mother    Heart disease Father    Cancer Father        throat   Other Daughter        Rett syndrome    Cancer Maternal Grandmother        breast    Social History Social History[1]   Allergies   Codeine and Tape   Review of Systems Review of Systems  Constitutional:  Positive for fatigue.  Respiratory:  Positive for cough.   Cardiovascular:  Positive for chest pain.     Physical Exam Triage Vital Signs ED Triage Vitals  Encounter Vitals Group     BP 10/14/24 0832 104/68     Girls Systolic BP Percentile --      Girls Diastolic BP Percentile --      Boys Systolic BP Percentile --      Boys Diastolic BP Percentile --      Pulse Rate 10/14/24 0832 79     Resp 10/14/24 0832 16     Temp 10/14/24 0832 98 F (36.7 C)     Temp Source 10/14/24 0832 Oral     SpO2 10/14/24  0832 98 %     Weight 10/14/24 0832 120 lb (54.4 kg)     Height 10/14/24 0832 5' 7 (1.702 m)     Head Circumference --      Peak Flow --      Pain Score 10/14/24 0847 0     Pain Loc --      Pain Education --      Exclude from Growth Chart --  No data found.  Updated Vital Signs BP 104/68 (BP Location: Right Arm)   Pulse 79   Temp 98 F (36.7 C) (Oral)   Resp 16   Ht 5' 7 (1.702 m)   Wt 120 lb (54.4 kg)   LMP  (LMP Unknown) Comment: Pt bled from 7/10-8/4  SpO2 98%   BMI 18.79 kg/m   Visual Acuity Right Eye Distance:   Left Eye Distance:   Bilateral Distance:    Right Eye Near:   Left Eye Near:    Bilateral Near:     Physical Exam Vitals reviewed.  Constitutional:      General: She is awake. She is not in acute distress.    Appearance: Normal appearance. She is well-developed and well-groomed. She is not ill-appearing, toxic-appearing or diaphoretic.  HENT:     Head: Normocephalic and atraumatic.     Mouth/Throat:     Lips: Pink.     Mouth: Mucous membranes are moist.  Cardiovascular:     Rate and Rhythm: Normal rate and regular rhythm.     Pulses: Normal pulses.          Radial pulses are 2+ on the right side and 2+ on the left side.     Heart sounds: Normal heart sounds. No murmur heard.    No friction rub. No gallop.  Pulmonary:     Effort: Pulmonary effort is normal.     Breath sounds: Normal breath sounds. No decreased air movement. No decreased breath sounds, wheezing, rhonchi or rales.  Musculoskeletal:     Cervical back: Normal range of motion and neck supple.  Lymphadenopathy:     Head:     Right side of head: No submental, submandibular or preauricular adenopathy.     Left side of head: No submental, submandibular or preauricular adenopathy.     Cervical:     Right cervical: No superficial cervical adenopathy.    Left cervical: No superficial cervical adenopathy.     Upper Body:     Right upper body: No supraclavicular adenopathy.     Left  upper body: No supraclavicular adenopathy.  Skin:    General: Skin is warm and dry.  Neurological:     General: No focal deficit present.     Mental Status: She is alert and oriented to person, place, and time.  Psychiatric:        Mood and Affect: Mood normal.        Behavior: Behavior normal. Behavior is cooperative.        Thought Content: Thought content normal.        Judgment: Judgment normal.      UC Treatments / Results  Labs (all labs ordered are listed, but only abnormal results are displayed) Labs Reviewed - No data to display  EKG   Radiology DG Chest 2 View Result Date: 10/14/2024 CLINICAL DATA:  Dry cough. EXAM: CHEST - 2 VIEW COMPARISON:  05/24/2022 FINDINGS: Azygous lobe again noted. The lungs are clear without focal pneumonia, edema, pneumothorax or pleural effusion. The cardiopericardial silhouette is within normal limits for size. No acute bony abnormality. IMPRESSION: No active cardiopulmonary disease. Electronically Signed   By: Camellia Candle M.D.   On: 10/14/2024 08:49    Procedures ED EKG  Date/Time: 10/14/2024 9:45 AM  Performed by: Marylene Rocky BRAVO, PA-C Authorized by: Marylene Rocky BRAVO, PA-C   Previous ECG:    Previous ECG:  Compared to current   Similarity:  No change   Comparison  ECG info:  05/29/22 Interpretation:    Interpretation: normal   Rate:    ECG rate:  79   ECG rate assessment: normal   Rhythm:    Rhythm: sinus rhythm   ST segments:    ST segments:  Normal T waves:    T waves: normal    (including critical care time)  Medications Ordered in UC Medications - No data to display  Initial Impression / Assessment and Plan / UC Course  I have reviewed the triage vital signs and the nursing notes.  Pertinent labs & imaging results that were available during my care of the patient were reviewed by me and considered in my medical decision making (see chart for details).      Final Clinical Impressions(s) / UC Diagnoses   Final  diagnoses:  Acute cough  Post-viral cough syndrome  Cough due to bronchospasm   Post-viral cough syndrome Persistent dry cough following a viral illness contracted at a wedding. Recent onset of chest pain, primarily around the heart area, exacerbated by activity and relieved by rest. No fever, chills, or productive cough. Chest x-ray reviewed. EKG performed to rule out cardiac causes, showing no concerning signs of STEMI. Differential includes post-viral cough syndrome with possible inflammation in the intercostal spaces. Previous hx of Tachycardia and mitral valve issues noted, but no acute cardiac event suspected at this time based on symptoms and EKG findings.  - Prescribed prednisone  40 mg once daily for 5 days to reduce inflammation and alleviate cough. - Advised to seek emergency care if experiencing severe pain not relieved by Tylenol  or ibuprofen , significant trouble breathing, or loss of consciousness.   Discharge Instructions      VISIT SUMMARY:  You came in today with a persistent cough and recent onset of chest pain. You suspect you got sick after attending a wedding. Your cough has been slow to resolve and you have developed a throbbing chest pain that worsens with activity. 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 We performed a chest x-ray and an EKG, both of which showed no concerning signs.  YOUR PLAN:  -POST-VIRAL COUGH SYNDROME: Post-viral cough syndrome is a persistent cough that follows a viral illness. Your cough is likely due to inflammation in the intercostal spaces. We have prescribed prednisone  40 mg once daily for 5 days to reduce inflammation and help alleviate your cough. Please seek emergency care if you experience severe pain not relieved by Tylenol  or ibuprofen , significant trouble breathing, or loss of consciousness.  INSTRUCTIONS:  Please follow the prescribed course of prednisone  and monitor your  symptoms. If you experience severe pain, significant trouble breathing, or loss of consciousness, seek emergency care immediately.     ED Prescriptions   None    PDMP not reviewed this encounter.     [1]  Social History Tobacco Use   Smoking status: Never   Smokeless tobacco: Never  Vaping Use   Vaping status: Never Used  Substance Use Topics   Alcohol use: Yes    Comment: Maybe one drink once a month   Drug use: No     Marylene Rocky BRAVO, PA-C 10/14/24 [id number]

## 2024-10-14 NOTE — ED Notes (Signed)
 EKG handed off to [facility] PA.
